# Patient Record
Sex: Female | Born: 1935 | Race: Black or African American | Hispanic: No
Health system: Southern US, Community
[De-identification: ages and names within clinical notes are randomized; demographics above are authoritative.]

## PROBLEM LIST (undated history)

## (undated) DIAGNOSIS — I472 Ventricular tachycardia: Secondary | ICD-10-CM

## (undated) DIAGNOSIS — J9621 Acute and chronic respiratory failure with hypoxia: Secondary | ICD-10-CM

## (undated) DIAGNOSIS — I4729 Other ventricular tachycardia: Secondary | ICD-10-CM

## (undated) DIAGNOSIS — J690 Pneumonitis due to inhalation of food and vomit: Secondary | ICD-10-CM

## (undated) DIAGNOSIS — N189 Chronic kidney disease, unspecified: Secondary | ICD-10-CM

## (undated) DIAGNOSIS — N179 Acute kidney failure, unspecified: Secondary | ICD-10-CM

---

## 2018-04-24 ENCOUNTER — Inpatient Hospital Stay
Admission: AD | Admit: 2018-04-24 | Discharge: 2018-06-24 | Disposition: E | Payer: Medicare Other | Source: Other Acute Inpatient Hospital | Attending: Internal Medicine | Admitting: Internal Medicine

## 2018-04-24 DIAGNOSIS — J9690 Respiratory failure, unspecified, unspecified whether with hypoxia or hypercapnia: Secondary | ICD-10-CM

## 2018-04-24 DIAGNOSIS — Z992 Dependence on renal dialysis: Secondary | ICD-10-CM

## 2018-04-24 DIAGNOSIS — R0603 Acute respiratory distress: Secondary | ICD-10-CM

## 2018-04-24 DIAGNOSIS — J690 Pneumonitis due to inhalation of food and vomit: Secondary | ICD-10-CM | POA: Diagnosis present

## 2018-04-24 DIAGNOSIS — R52 Pain, unspecified: Secondary | ICD-10-CM

## 2018-04-24 DIAGNOSIS — Z0189 Encounter for other specified special examinations: Secondary | ICD-10-CM

## 2018-04-24 DIAGNOSIS — T8241XA Breakdown (mechanical) of vascular dialysis catheter, initial encounter: Secondary | ICD-10-CM

## 2018-04-24 DIAGNOSIS — J9621 Acute and chronic respiratory failure with hypoxia: Secondary | ICD-10-CM | POA: Diagnosis present

## 2018-04-24 DIAGNOSIS — Z4659 Encounter for fitting and adjustment of other gastrointestinal appliance and device: Secondary | ICD-10-CM

## 2018-04-24 DIAGNOSIS — N189 Chronic kidney disease, unspecified: Secondary | ICD-10-CM | POA: Diagnosis present

## 2018-04-24 DIAGNOSIS — Z431 Encounter for attention to gastrostomy: Secondary | ICD-10-CM

## 2018-04-24 DIAGNOSIS — N179 Acute kidney failure, unspecified: Secondary | ICD-10-CM

## 2018-04-24 DIAGNOSIS — I472 Ventricular tachycardia: Secondary | ICD-10-CM

## 2018-04-24 DIAGNOSIS — N289 Disorder of kidney and ureter, unspecified: Secondary | ICD-10-CM

## 2018-04-24 DIAGNOSIS — Z789 Other specified health status: Secondary | ICD-10-CM

## 2018-04-24 DIAGNOSIS — I4729 Other ventricular tachycardia: Secondary | ICD-10-CM

## 2018-04-24 DIAGNOSIS — T85598A Other mechanical complication of other gastrointestinal prosthetic devices, implants and grafts, initial encounter: Secondary | ICD-10-CM

## 2018-04-24 HISTORY — DX: Chronic kidney disease, unspecified: N18.9

## 2018-04-24 HISTORY — DX: Ventricular tachycardia: I47.2

## 2018-04-24 HISTORY — DX: Acute kidney failure, unspecified: N17.9

## 2018-04-24 HISTORY — DX: Acute and chronic respiratory failure with hypoxia: J96.21

## 2018-04-24 HISTORY — DX: Pneumonitis due to inhalation of food and vomit: J69.0

## 2018-04-24 HISTORY — DX: Other ventricular tachycardia: I47.29

## 2018-04-25 ENCOUNTER — Other Ambulatory Visit (HOSPITAL_COMMUNITY): Payer: Medicare Other

## 2018-04-25 LAB — BASIC METABOLIC PANEL
Anion gap: 12 (ref 5–15)
BUN: 110 mg/dL — ABNORMAL HIGH (ref 8–23)
CO2: 15 mmol/L — ABNORMAL LOW (ref 22–32)
Calcium: 8.6 mg/dL — ABNORMAL LOW (ref 8.9–10.3)
Chloride: 109 mmol/L (ref 98–111)
Creatinine, Ser: 2.98 mg/dL — ABNORMAL HIGH (ref 0.44–1.00)
GFR calc Af Amer: 16 mL/min — ABNORMAL LOW (ref >60)
GFR calc non Af Amer: 14 mL/min — ABNORMAL LOW (ref >60)
Glucose, Bld: 258 mg/dL — ABNORMAL HIGH (ref 70–99)
Potassium: 3.3 mmol/L — ABNORMAL LOW (ref 3.5–5.1)
Sodium: 136 mmol/L (ref 135–145)

## 2018-04-25 LAB — CBC
HCT: 30.2 % — ABNORMAL LOW (ref 36.0–46.0)
Hemoglobin: 10.2 g/dL — ABNORMAL LOW (ref 12.0–15.0)
MCH: 27.5 pg (ref 26.0–34.0)
MCHC: 33.8 g/dL (ref 30.0–36.0)
MCV: 81.4 fL (ref 80.0–100.0)
Platelets: 243 10*3/uL (ref 150–400)
RBC: 3.71 MIL/uL — ABNORMAL LOW (ref 3.87–5.11)
RDW: 16.2 % — ABNORMAL HIGH (ref 11.5–15.5)
WBC: 11 10*3/uL — ABNORMAL HIGH (ref 4.0–10.5)
nRBC: 0 % (ref 0.0–0.2)

## 2018-04-25 MED ORDER — GENERIC EXTERNAL MEDICATION
Status: DC
Start: ? — End: 2018-04-25

## 2018-04-25 MED ORDER — ACETAMINOPHEN 650 MG RE SUPP
650.00 | RECTAL | Status: DC
Start: ? — End: 2018-04-25

## 2018-04-25 MED ORDER — DEXTROSE 10 % IV SOLN
63.00 | INTRAVENOUS | Status: DC
Start: ? — End: 2018-04-25

## 2018-04-25 MED ORDER — DEXTROSE-NACL 5-0.9 % IV SOLN
63.00 | INTRAVENOUS | Status: DC
Start: ? — End: 2018-04-25

## 2018-04-25 MED ORDER — ALTEPLASE 2 MG IJ SOLR
2.00 | INTRAMUSCULAR | Status: DC
Start: ? — End: 2018-04-25

## 2018-04-25 MED ORDER — IOPAMIDOL (ISOVUE-370) INJECTION 76%
100.00 | INTRAVENOUS | Status: DC
Start: ? — End: 2018-04-25

## 2018-04-25 MED ORDER — SODIUM CHLORIDE (PF) 0.9 % IJ SOLN
10.00 | INTRAMUSCULAR | Status: DC
Start: ? — End: 2018-04-25

## 2018-04-25 MED ORDER — INSULIN GLARGINE 100 UNIT/ML ~~LOC~~ SOLN
10.00 | SUBCUTANEOUS | Status: DC
Start: 2018-04-25 — End: 2018-04-25

## 2018-04-25 MED ORDER — SODIUM CHLORIDE 0.45 % IV SOLN
50.00 | INTRAVENOUS | Status: DC
Start: ? — End: 2018-04-25

## 2018-04-25 MED ORDER — FUROSEMIDE 10 MG/ML IJ SOLN
20.00 | INTRAMUSCULAR | Status: DC
Start: 2018-04-25 — End: 2018-04-25

## 2018-04-25 MED ORDER — FAT EMULSION PLANT BASED 20 % IV EMUL
250.00 | INTRAVENOUS | Status: DC
Start: ? — End: 2018-04-25

## 2018-04-25 MED ORDER — METOPROLOL TARTRATE 5 MG/5ML IV SOLN
2.50 | INTRAVENOUS | Status: DC
Start: 2018-04-24 — End: 2018-04-25

## 2018-04-25 MED ORDER — ENOXAPARIN SODIUM 30 MG/0.3ML ~~LOC~~ SOLN
30.00 | SUBCUTANEOUS | Status: DC
Start: 2018-04-25 — End: 2018-04-25

## 2018-04-25 MED ORDER — HYDRALAZINE HCL 20 MG/ML IJ SOLN
10.00 | INTRAMUSCULAR | Status: DC
Start: ? — End: 2018-04-25

## 2018-04-25 MED ORDER — GENERIC EXTERNAL MEDICATION
Status: DC
Start: 2018-04-24 — End: 2018-04-25

## 2018-04-26 LAB — BASIC METABOLIC PANEL
Anion gap: 13 (ref 5–15)
BUN: 111 mg/dL — ABNORMAL HIGH (ref 8–23)
CO2: 15 mmol/L — ABNORMAL LOW (ref 22–32)
Calcium: 8.9 mg/dL (ref 8.9–10.3)
Chloride: 110 mmol/L (ref 98–111)
Creatinine, Ser: 3.1 mg/dL — ABNORMAL HIGH (ref 0.44–1.00)
GFR calc Af Amer: 15 mL/min — ABNORMAL LOW (ref >60)
GFR calc non Af Amer: 13 mL/min — ABNORMAL LOW (ref >60)
Glucose, Bld: 175 mg/dL — ABNORMAL HIGH (ref 70–99)
Potassium: 4.1 mmol/L (ref 3.5–5.1)
Sodium: 138 mmol/L (ref 135–145)

## 2018-04-26 LAB — CBC
HCT: 33.6 % — ABNORMAL LOW (ref 36.0–46.0)
Hemoglobin: 10.7 g/dL — ABNORMAL LOW (ref 12.0–15.0)
MCH: 26.4 pg (ref 26.0–34.0)
MCHC: 31.8 g/dL (ref 30.0–36.0)
MCV: 83 fL (ref 80.0–100.0)
Platelets: 300 10*3/uL (ref 150–400)
RBC: 4.05 MIL/uL (ref 3.87–5.11)
RDW: 16.3 % — ABNORMAL HIGH (ref 11.5–15.5)
WBC: 15.3 10*3/uL — ABNORMAL HIGH (ref 4.0–10.5)
nRBC: 0 % (ref 0.0–0.2)

## 2018-04-26 LAB — PHOSPHORUS: Phosphorus: 4.5 mg/dL (ref 2.5–4.6)

## 2018-04-26 LAB — MAGNESIUM: Magnesium: 1.8 mg/dL (ref 1.7–2.4)

## 2018-04-26 LAB — TRIGLYCERIDES: Triglycerides: 125 mg/dL (ref <150)

## 2018-04-28 LAB — BASIC METABOLIC PANEL
Anion gap: 12 (ref 5–15)
BUN: 146 mg/dL — ABNORMAL HIGH (ref 8–23)
CO2: 12 mmol/L — ABNORMAL LOW (ref 22–32)
Calcium: 8.4 mg/dL — ABNORMAL LOW (ref 8.9–10.3)
Chloride: 114 mmol/L — ABNORMAL HIGH (ref 98–111)
Creatinine, Ser: 3.3 mg/dL — ABNORMAL HIGH (ref 0.44–1.00)
GFR calc Af Amer: 14 mL/min — ABNORMAL LOW (ref >60)
GFR calc non Af Amer: 12 mL/min — ABNORMAL LOW (ref >60)
Glucose, Bld: 306 mg/dL — ABNORMAL HIGH (ref 70–99)
Potassium: 4.1 mmol/L (ref 3.5–5.1)
Sodium: 138 mmol/L (ref 135–145)

## 2018-04-29 ENCOUNTER — Other Ambulatory Visit (HOSPITAL_COMMUNITY): Payer: Medicare Other

## 2018-04-29 LAB — BASIC METABOLIC PANEL
Anion gap: 12 (ref 5–15)
BUN: 142 mg/dL — ABNORMAL HIGH (ref 8–23)
CO2: 14 mmol/L — ABNORMAL LOW (ref 22–32)
Calcium: 8.3 mg/dL — ABNORMAL LOW (ref 8.9–10.3)
Chloride: 111 mmol/L (ref 98–111)
Creatinine, Ser: 3.52 mg/dL — ABNORMAL HIGH (ref 0.44–1.00)
GFR calc Af Amer: 13 mL/min — ABNORMAL LOW (ref >60)
GFR calc non Af Amer: 11 mL/min — ABNORMAL LOW (ref >60)
Glucose, Bld: 161 mg/dL — ABNORMAL HIGH (ref 70–99)
Potassium: 3.9 mmol/L (ref 3.5–5.1)
Sodium: 137 mmol/L (ref 135–145)

## 2018-04-29 LAB — PHOSPHORUS: Phosphorus: 6 mg/dL — ABNORMAL HIGH (ref 2.5–4.6)

## 2018-04-29 LAB — MAGNESIUM: Magnesium: 1.8 mg/dL (ref 1.7–2.4)

## 2018-04-29 MED ORDER — GENERIC EXTERNAL MEDICATION
Status: DC
Start: ? — End: 2018-04-29

## 2018-04-29 NOTE — Consult Note (Signed)
[facility] CONSULT NOTE    Date: 04/29/2018                  Patient Name:  Lori Mack  MRN: [id number]  DOB: [birth year]  Age / Sex: 85 y.o., female         PCP: Edmonia James, PA-C                 Service Requesting Consult: Hospitalist                 Reason for Consult: Acute renal failure            History of Present Illness: Patient is a 85 y.o. female with a PMHx of stage IV sacral decubitus ulcer, failure to thrive, GERD, immobility, severe protein calorie malnutrition, recent acute renal failure, MGUS, diabetes mellitus type 2, hypertension, who was admitted to [facility] on 05/21/2018 for ongoing treatment of Candida glabrata sepsis, pancolitis with gastritis, altered mental status, failure to thrive, protein calorie malnutrition, recurrent aspiration, and acute renal failure.  We are asked to see the patient for acute renal failure in the setting of known chronic kidney disease stage IV.  Prior baseline creatinine was 1.6.  Upon admission here creatinine was 2.98 but has now risen to 3.52.  Urine output 550 cc.  Patient unable to provide any history regarding current illness.   Medications: D10 drip, Lasix 20 mg IV daily, thiamine 100 mg IV daily, Humalog sliding scale, albuterol 3 cc inhaled 3 times daily, Lantus insulin 10 units daily, Lovenox 30 mg daily, metoprolol 2.5 mg 4 times daily, morphine 1 mg IV push PRN    Allergies: No known drug allergies   Past Medical History: stage IV sacral decubitus ulcer, failure to thrive, GERD, immobility, severe protein calorie malnutrition, recent acute renal failure, MGUS, diabetes mellitus type 2, hypertension  Past Surgical History: Hernia repair Appendectomy  Family History: Significant for hypertension and CVA in the patient's mother.  Social History: Unable to obtain from the patient and she has altered mental status.   Review of Systems: Unable to obtain from patient as she has altered  mental status.  Vital Signs: Temperature 98.9 pulse 111 respirations 23 blood pressure 131/66 Weight trends: There were no vitals filed for this visit.  Physical Exam: General: Chronically ill appearing  Head: Normocephalic, atraumatic.  Eyes: Anicteric, EOMI  Nose: Mucous membranes dry, not inflammed, nonerythematous.  Throat: Oropharynx nonerythematous, no exudate appreciated.   Neck: Supple, trachea midline.  Lungs:  Normal respiratory effort. Clear to auscultation BL without crackles or wheezes.  Heart: RRR. S1 and S2 normal without gallop, murmur, or rubs.  Abdomen:  BS normoactive. Soft, Nondistended, non-tender.  No masses or organomegaly.  Extremities: No pretibial edema.  Neurologic: Arousable but non conversant  Skin: No visible rashes, scars.    Lab results: Basic Metabolic Panel: Recent Labs  Lab 04/26/18 0438 04/28/18 0727 04/29/18 0443  NA 138 138 137  K 4.1 4.1 3.9  CL 110 114* 111  CO2 15* 12* 14*  GLUCOSE 175* 306* 161*  BUN 111* 146* 142*  CREATININE 3.10* 3.30* 3.52*  CALCIUM 8.9 8.4* 8.3*  MG 1.8  --  1.8  PHOS 4.5  --  6.0*    Liver Function Tests: No results for input(s): AST, ALT, ALKPHOS, BILITOT, PROT, ALBUMIN in the last 168 hours. No results for input(s): LIPASE, AMYLASE in the last 168 hours. No results for input(s): AMMONIA in the last 168 hours.  CBC: Recent Labs  Lab 04/25/18 0755 04/26/18 0438  WBC 11.0* 15.3*  HGB 10.2* 10.7*  HCT 30.2* 33.6*  MCV 81.4 83.0  PLT 243 300    Cardiac Enzymes: No results for input(s): CKTOTAL, CKMB, CKMBINDEX, TROPONINI in the last 168 hours.  BNP: Invalid input(s): POCBNP  CBG: No results for input(s): GLUCAP in the last 168 hours.  Microbiology: No results found for this or any previous visit.  Coagulation Studies: No results for input(s): LABPROT, INR in the last 72 hours.  Urinalysis: No results for input(s): COLORURINE, LABSPEC, PHURINE, GLUCOSEU, HGBUR, BILIRUBINUR,  KETONESUR, PROTEINUR, UROBILINOGEN, NITRITE, LEUKOCYTESUR in the last 72 hours.  Invalid input(s): APPERANCEUR    Imaging:  No results found.   Assessment & Plan: Pt is a 85 y.o. female with a PMHx of stage IV sacral decubitus ulcer, failure to thrive, GERD, immobility, severe protein calorie malnutrition, recent acute renal failure, MGUS, diabetes mellitus type 2, hypertension, who was admitted to [facility] on 05/08/2018 for ongoing treatment of Candida glabrata sepsis, pancolitis with gastritis, altered mental status, failure to thrive, protein calorie malnutrition, recurrent aspiration, and acute renal failure.  1.  Acute renal failure. 2.  Chronic kidney disease stage IV. 3.  Altered mental status. 4.  Failure to thrive. 5.  Severe protein calorie malnutrition.  Plan: We asked to see the patient for evaluation management of acute renal failure.  Suspect that Lasix may be playing some role in acute deterioration.  Discontinue Lasix for now.  Continue D10 for now.  Consider renal ultrasound if renal function does not improve.  Patient would make a very poor candidate for long-term dialysis given her multiple comorbidities, severe malnutrition, nonverbal status.  Further plan as patient progresses.  Thanks for consultation.

## 2018-04-30 ENCOUNTER — Other Ambulatory Visit (HOSPITAL_COMMUNITY): Payer: Medicare Other

## 2018-04-30 LAB — RENAL FUNCTION PANEL
Albumin: 1.4 g/dL — ABNORMAL LOW (ref 3.5–5.0)
Anion gap: 15 (ref 5–15)
BUN: 136 mg/dL — ABNORMAL HIGH (ref 8–23)
CO2: 11 mmol/L — ABNORMAL LOW (ref 22–32)
Calcium: 8.4 mg/dL — ABNORMAL LOW (ref 8.9–10.3)
Chloride: 110 mmol/L (ref 98–111)
Creatinine, Ser: 3.81 mg/dL — ABNORMAL HIGH (ref 0.44–1.00)
GFR calc Af Amer: 12 mL/min — ABNORMAL LOW (ref >60)
GFR calc non Af Amer: 10 mL/min — ABNORMAL LOW (ref >60)
Glucose, Bld: 186 mg/dL — ABNORMAL HIGH (ref 70–99)
Phosphorus: 6.5 mg/dL — ABNORMAL HIGH (ref 2.5–4.6)
Potassium: 3.8 mmol/L (ref 3.5–5.1)
Sodium: 136 mmol/L (ref 135–145)

## 2018-04-30 MED ORDER — GENERIC EXTERNAL MEDICATION
Status: DC
Start: ? — End: 2018-04-30

## 2018-04-30 NOTE — Progress Notes (Signed)
IR requested by Janora Norlander, NP for possible image-guided percutaneous gastrotomy tube placement.  CT abdomen from 04/29/2018 reviewed by Dr. Vernard Gambles who states patient's anatomy is not approachable for percutaneous approach. Because of this, procedure will not occur. Dr. Owens Shark made aware.  IR available in future if needed.  Bea Graff Louk, PA-C 04/30/2018, 8:54 AM

## 2018-05-01 LAB — TRIGLYCERIDES: Triglycerides: 149 mg/dL (ref <150)

## 2018-05-01 LAB — CBC
HCT: 28 % — ABNORMAL LOW (ref 36.0–46.0)
Hemoglobin: 9.1 g/dL — ABNORMAL LOW (ref 12.0–15.0)
MCH: 26.5 pg (ref 26.0–34.0)
MCHC: 32.5 g/dL (ref 30.0–36.0)
MCV: 81.6 fL (ref 80.0–100.0)
Platelets: 272 10*3/uL (ref 150–400)
RBC: 3.43 MIL/uL — ABNORMAL LOW (ref 3.87–5.11)
RDW: 15.9 % — ABNORMAL HIGH (ref 11.5–15.5)
WBC: 13.8 10*3/uL — ABNORMAL HIGH (ref 4.0–10.5)
nRBC: 0 % (ref 0.0–0.2)

## 2018-05-01 LAB — BASIC METABOLIC PANEL
Anion gap: 15 (ref 5–15)
BUN: 139 mg/dL — ABNORMAL HIGH (ref 8–23)
CO2: 12 mmol/L — ABNORMAL LOW (ref 22–32)
Calcium: 8.6 mg/dL — ABNORMAL LOW (ref 8.9–10.3)
Chloride: 107 mmol/L (ref 98–111)
Creatinine, Ser: 4.01 mg/dL — ABNORMAL HIGH (ref 0.44–1.00)
GFR calc Af Amer: 11 mL/min — ABNORMAL LOW (ref >60)
GFR calc non Af Amer: 10 mL/min — ABNORMAL LOW (ref >60)
Glucose, Bld: 234 mg/dL — ABNORMAL HIGH (ref 70–99)
Potassium: 3.9 mmol/L (ref 3.5–5.1)
Sodium: 134 mmol/L — ABNORMAL LOW (ref 135–145)

## 2018-05-01 LAB — MAGNESIUM: Magnesium: 1.6 mg/dL — ABNORMAL LOW (ref 1.7–2.4)

## 2018-05-01 NOTE — Progress Notes (Signed)
[facility]  ROUNDING NOTE   Subjective:  Patient remains critically ill. BUN currently 139 with a creatinine of 4.01. Still producing urine however. Also appears to have significant metabolic acidosis with most recent serum bicarbonate of 12.   Objective:  Vital signs in last 24 hours:  Temperature 97.4 pulse 110 respirations 30 blood pressure 157/82  Physical Exam: General: Critically ill-appearing female  Head: Normocephalic, atraumatic. Moist oral mucosal membranes  Eyes: Anicteric  Neck: Supple, trachea midline  Lungs:  Scattered rhonchi  Heart: S1S2 no rubs  Abdomen:  Soft, nontender, bowel sounds present  Extremities: 1+ peripheral edema.  Neurologic: Lethargic but arousable  Skin: No lesions       Basic Metabolic Panel: Recent Labs  Lab 04/26/18 0438 04/28/18 0727 04/29/18 0443 04/30/18 0611 05/01/18 0534  NA 138 138 137 136 134*  K 4.1 4.1 3.9 3.8 3.9  CL 110 114* 111 110 107  CO2 15* 12* 14* 11* 12*  GLUCOSE 175* 306* 161* 186* 234*  BUN 111* 146* 142* 136* 139*  CREATININE 3.10* 3.30* 3.52* 3.81* 4.01*  CALCIUM 8.9 8.4* 8.3* 8.4* 8.6*  MG 1.8  --  1.8  --  1.6*  PHOS 4.5  --  6.0* 6.5*  --     Liver Function Tests: Recent Labs  Lab 04/30/18 0611  ALBUMIN 1.4*   No results for input(s): LIPASE, AMYLASE in the last 168 hours. No results for input(s): AMMONIA in the last 168 hours.  CBC: Recent Labs  Lab 04/25/18 0755 04/26/18 0438 05/01/18 0534  WBC 11.0* 15.3* 13.8*  HGB 10.2* 10.7* 9.1*  HCT 30.2* 33.6* 28.0*  MCV 81.4 83.0 81.6  PLT 243 300 272    Cardiac Enzymes: No results for input(s): CKTOTAL, CKMB, CKMBINDEX, TROPONINI in the last 168 hours.  BNP: Invalid input(s): POCBNP  CBG: No results for input(s): GLUCAP in the last 168 hours.  Microbiology: No results found for this or any previous visit.  Coagulation Studies: No results for input(s): LABPROT, INR in the last 72 hours.  Urinalysis: No results for  input(s): COLORURINE, LABSPEC, PHURINE, GLUCOSEU, HGBUR, BILIRUBINUR, KETONESUR, PROTEINUR, UROBILINOGEN, NITRITE, LEUKOCYTESUR in the last 72 hours.  Invalid input(s): APPERANCEUR    Imaging: Ct Abdomen Wo Contrast  Result Date: 04/29/2018 CLINICAL DATA:  Gastrostomy tube evaluation EXAM: CT ABDOMEN AND PELVIS WITHOUT CONTRAST TECHNIQUE: Multidetector CT imaging of the abdomen and pelvis was performed following the standard protocol without IV contrast. COMPARISON:  None. FINDINGS: Lower chest: There is patchy hyperdense material in both posterior lower lobes. This may represent calcification, however aspirated barium is also a possibility. There is mucoid filling of right lower lobe bronchial airways and more confluent consolidation at the posterior right lung base. There are nonspecific reticular markings at the lung bases left greater than right which may represent early interstitial lung disease. Hepatobiliary: Unremarkable Pancreas: Unremarkable Spleen: Unremarkable Adrenals/Urinary Tract: There is prominence of the adrenal glands without focal mass in a hyperplasia pattern. There is a punctate calculus in the upper pole of the left kidney. Atrophy of the right kidney is noted. Stomach/Bowel: The body of the stomach is position posterior and inferior to the transverse colon. Gastrostomy tube placement is virtually contraindicated based on this anatomy. Small bowel is decompressed. No obvious mass in the colon. Vascular/Lymphatic: No abnormal retroperitoneal adenopathy. Aortic and iliac artery atherosclerotic calcifications are noted. Other: No free fluid. Musculoskeletal: No vertebral compression deformity. Mild scoliosis is noted. IMPRESSION: The body of the stomach is position posterior  and inferior to the transverse colon. Gastrostomy tube placement is virtually contraindicated based on anatomy. There is consolidation at the posterior right lung base associated with filling of the airways. There is  also hyperdense material representing calcification or possibly aspirated barium. Electronically Signed   By: Marybelle Killings M.D.   On: 04/29/2018 15:42   US Renal  Result Date: 04/30/2018 CLINICAL DATA:  Acute renal failure. EXAM: RENAL / URINARY TRACT ULTRASOUND COMPLETE COMPARISON:  CT scan of the abdomen dated 04/29/2018 FINDINGS: Right Kidney: Renal measurements: 7.6 x 3.0 x 3.2 cm = volume: 38 mL. Diffuse increased echogenicity of the renal parenchyma. No hydronephrosis. No masses. Left Kidney: Renal measurements: 9.7 x 4.9 x 3.9 cm = volume: 99 mL. Increased echogenicity without hydronephrosis. No mass lesions. Bladder: Extensive debris in the dependent portion of the bladder. IMPRESSION: 1. Small echogenic kidneys consistent with renal medical disease. 2. Debris in the bladder, nonspecific. However, this is common with bladder infections. Electronically Signed   By: Lorriane Shire M.D.   On: 04/30/2018 16:13     Medications:       Assessment/ Plan:  85 y.o. female with a PMHx of stage IV sacral decubitus ulcer, failure to thrive, GERD, immobility, severe protein calorie malnutrition, recent acute renal failure, MGUS, diabetes mellitus type 2, hypertension, who was admitted to [facility] on 05/01/2018 for ongoing treatment of Candida glabrata sepsis, pancolitis with gastritis, altered mental status, failure to thrive, protein calorie malnutrition, recurrent aspiration, and acute renal failure.  1.  Acute renal failure. 2.  Chronic kidney disease stage IV. 3.  Altered mental status. 4.  Failure to thrive. 5.  Severe protein calorie malnutrition.  Plan:   Patient continues to have significant renal dysfunction.  Creatinine currently 4.0.  Patient is producing some urine.  No urgent indication to start dialysis however this may need to be considered if family continues to desire aggressive care.  Her anatomy was not suitable for a PEG tube placement.  Diuretics have been stopped for now.   Continue supportive care for now.  We will continue to monitor her progress closely.   LOS: 0 Lori Mack 4/8/20208:18 AM

## 2018-05-02 ENCOUNTER — Other Ambulatory Visit (HOSPITAL_COMMUNITY): Payer: Medicare Other

## 2018-05-02 LAB — BASIC METABOLIC PANEL
Anion gap: 16 — ABNORMAL HIGH (ref 5–15)
BUN: 129 mg/dL — ABNORMAL HIGH (ref 8–23)
CO2: 11 mmol/L — ABNORMAL LOW (ref 22–32)
Calcium: 8.5 mg/dL — ABNORMAL LOW (ref 8.9–10.3)
Chloride: 107 mmol/L (ref 98–111)
Creatinine, Ser: 4.13 mg/dL — ABNORMAL HIGH (ref 0.44–1.00)
GFR calc Af Amer: 11 mL/min — ABNORMAL LOW (ref >60)
GFR calc non Af Amer: 9 mL/min — ABNORMAL LOW (ref >60)
Glucose, Bld: 187 mg/dL — ABNORMAL HIGH (ref 70–99)
Potassium: 4.1 mmol/L (ref 3.5–5.1)
Sodium: 134 mmol/L — ABNORMAL LOW (ref 135–145)

## 2018-05-02 LAB — GLUCOSE 6 PHOSPHATE DEHYDROGENASE
G6PDH: 13.2 U/g{Hb} (ref 4.6–13.5)
Hemoglobin: 9.3 g/dL — ABNORMAL LOW (ref 11.1–15.9)

## 2018-05-02 LAB — PHOSPHORUS: Phosphorus: 7.1 mg/dL — ABNORMAL HIGH (ref 2.5–4.6)

## 2018-05-02 LAB — MAGNESIUM: Magnesium: 2.5 mg/dL — ABNORMAL HIGH (ref 1.7–2.4)

## 2018-05-02 MED ORDER — GENERIC EXTERNAL MEDICATION
Status: DC
Start: ? — End: 2018-05-02

## 2018-05-03 LAB — BASIC METABOLIC PANEL
Anion gap: 16 — ABNORMAL HIGH (ref 5–15)
BUN: 128 mg/dL — ABNORMAL HIGH (ref 8–23)
CO2: 13 mmol/L — ABNORMAL LOW (ref 22–32)
Calcium: 8.5 mg/dL — ABNORMAL LOW (ref 8.9–10.3)
Chloride: 107 mmol/L (ref 98–111)
Creatinine, Ser: 4.4 mg/dL — ABNORMAL HIGH (ref 0.44–1.00)
GFR calc Af Amer: 10 mL/min — ABNORMAL LOW (ref >60)
GFR calc non Af Amer: 8 mL/min — ABNORMAL LOW (ref >60)
Glucose, Bld: 166 mg/dL — ABNORMAL HIGH (ref 70–99)
Potassium: 3.6 mmol/L (ref 3.5–5.1)
Sodium: 136 mmol/L (ref 135–145)

## 2018-05-03 LAB — TRIGLYCERIDES: Triglycerides: 155 mg/dL — ABNORMAL HIGH (ref <150)

## 2018-05-03 NOTE — Progress Notes (Signed)
[facility]  ROUNDING NOTE   Subjective:  Patient remains critically ill. Urine output was only 300 cc over the preceding 24 hours. Continues to have significantly diminished renal function.   Objective:  Vital signs in last 24 hours:  Temperature 98.1 pulse 100 respirations 24 blood pressure 135/68  Physical Exam: General: Critically ill-appearing female  Head: Normocephalic, atraumatic. Moist oral mucosal membranes  Eyes: Anicteric  Neck: Supple, trachea midline  Lungs:  Scattered rhonchi  Heart: S1S2 no rubs  Abdomen:  Soft, nontender, bowel sounds present  Extremities: 1+ peripheral edema.  Neurologic: Lethargic but arousable  Skin: No lesions       Basic Metabolic Panel: Recent Labs  Lab 04/28/18 0727 04/29/18 0443 04/30/18 0611 05/01/18 0534 05/02/18 0650  NA 138 137 136 134* 134*  K 4.1 3.9 3.8 3.9 4.1  CL 114* 111 110 107 107  CO2 12* 14* 11* 12* 11*  GLUCOSE 306* 161* 186* 234* 187*  BUN 146* 142* 136* 139* 129*  CREATININE 3.30* 3.52* 3.81* 4.01* 4.13*  CALCIUM 8.4* 8.3* 8.4* 8.6* 8.5*  MG  --  1.8  --  1.6* 2.5*  PHOS  --  6.0* 6.5*  --  7.1*    Liver Function Tests: Recent Labs  Lab 04/30/18 0611  ALBUMIN 1.4*   No results for input(s): LIPASE, AMYLASE in the last 168 hours. No results for input(s): AMMONIA in the last 168 hours.  CBC: Recent Labs  Lab 05/01/18 0534  WBC 13.8*  HGB 9.1*  9.3*  HCT 28.0*  MCV 81.6  PLT 272    Cardiac Enzymes: No results for input(s): CKTOTAL, CKMB, CKMBINDEX, TROPONINI in the last 168 hours.  BNP: Invalid input(s): POCBNP  CBG: No results for input(s): GLUCAP in the last 168 hours.  Microbiology: No results found for this or any previous visit.  Coagulation Studies: No results for input(s): LABPROT, INR in the last 72 hours.  Urinalysis: No results for input(s): COLORURINE, LABSPEC, PHURINE, GLUCOSEU, HGBUR, BILIRUBINUR, KETONESUR, PROTEINUR, UROBILINOGEN, NITRITE, LEUKOCYTESUR  in the last 72 hours.  Invalid input(s): APPERANCEUR    Imaging: Dg Abd 1 View  Result Date: 05/02/2018 CLINICAL DATA:  Confirm nasogastric tube placement. EXAM: ABDOMEN - 1 VIEW COMPARISON:  CT, 04/29/2018 FINDINGS: Nasogastric tube passes below the diaphragm. Metallic tip projects in the mid stomach. Normal bowel gas pattern. IMPRESSION: Nasogastric tube metallic tip projects in the mid stomach. Electronically Signed   By: Lajean Manes M.D.   On: 05/02/2018 14:22     Medications:       Assessment/ Plan:  85 y.o. female with a PMHx of stage IV sacral decubitus ulcer, failure to thrive, GERD, immobility, severe protein calorie malnutrition, recent acute renal failure, MGUS, diabetes mellitus type 2, hypertension, who was admitted to [facility] on 05/09/2018 for ongoing treatment of Candida glabrata sepsis, pancolitis with gastritis, altered mental status, failure to thrive, protein calorie malnutrition, recurrent aspiration, and acute renal failure.  1.  Acute renal failure. 2.  Chronic kidney disease stage IV. 3.  Altered mental status. 4.  Failure to thrive. 5.  Severe protein calorie malnutrition. 6.  Acidosis.  Plan:   Patient remains critically ill.  She has multiple comorbidities.  Her BUN is now up to 129 with a creatinine of 4.13.  Doubt that renal placement therapy would change the ultimate outcome however this could be considered if the family continues to desire aggressive care.  Dr. Owens Shark to have discussion with the patient's family regarding goals of  care.  If aggressive care is desired then temporary dialysis catheter could be placed and we could potentially proceed with renal replacement therapy.  However as above patient has a very guarded overall prognosis.   LOS: 0 Yichen Gilardi 4/10/202010:49 AM

## 2018-05-04 LAB — BASIC METABOLIC PANEL WITH GFR
Anion gap: 16 — ABNORMAL HIGH (ref 5–15)
BUN: 130 mg/dL — ABNORMAL HIGH (ref 8–23)
CO2: 14 mmol/L — ABNORMAL LOW (ref 22–32)
Calcium: 8.5 mg/dL — ABNORMAL LOW (ref 8.9–10.3)
Chloride: 108 mmol/L (ref 98–111)
Creatinine, Ser: 4.37 mg/dL — ABNORMAL HIGH (ref 0.44–1.00)
GFR calc Af Amer: 10 mL/min — ABNORMAL LOW
GFR calc non Af Amer: 9 mL/min — ABNORMAL LOW
Glucose, Bld: 156 mg/dL — ABNORMAL HIGH (ref 70–99)
Potassium: 3.3 mmol/L — ABNORMAL LOW (ref 3.5–5.1)
Sodium: 138 mmol/L (ref 135–145)

## 2018-05-04 LAB — MAGNESIUM: Magnesium: 2.4 mg/dL (ref 1.7–2.4)

## 2018-05-05 LAB — BASIC METABOLIC PANEL
Anion gap: 14 (ref 5–15)
BUN: 137 mg/dL — ABNORMAL HIGH (ref 8–23)
CO2: 15 mmol/L — ABNORMAL LOW (ref 22–32)
Calcium: 8.5 mg/dL — ABNORMAL LOW (ref 8.9–10.3)
Chloride: 111 mmol/L (ref 98–111)
Creatinine, Ser: 4.45 mg/dL — ABNORMAL HIGH (ref 0.44–1.00)
GFR calc Af Amer: 10 mL/min — ABNORMAL LOW (ref >60)
GFR calc non Af Amer: 8 mL/min — ABNORMAL LOW (ref >60)
Glucose, Bld: 130 mg/dL — ABNORMAL HIGH (ref 70–99)
Potassium: 4.1 mmol/L (ref 3.5–5.1)
Sodium: 140 mmol/L (ref 135–145)

## 2018-05-05 LAB — MAGNESIUM: Magnesium: 2.2 mg/dL (ref 1.7–2.4)

## 2018-05-05 LAB — GLUCOSE 6 PHOSPHATE DEHYDROGENASE
G6PDH: 13.3 U/g{Hb} (ref 4.6–13.5)
Hemoglobin: 8 g/dL — ABNORMAL LOW (ref 11.1–15.9)

## 2018-05-05 LAB — PHOSPHORUS: Phosphorus: 5.2 mg/dL — ABNORMAL HIGH (ref 2.5–4.6)

## 2018-05-06 ENCOUNTER — Other Ambulatory Visit (HOSPITAL_COMMUNITY): Payer: Medicare Other

## 2018-05-06 LAB — BLOOD GAS, ARTERIAL
Acid-base deficit: 7.1 mmol/L — ABNORMAL HIGH (ref 0.0–2.0)
Bicarbonate: 16.5 mmol/L — ABNORMAL LOW (ref 20.0–28.0)
FIO2: 0.21
O2 Saturation: 95 %
Patient temperature: 98.6
pCO2 arterial: 25.4 mmHg — ABNORMAL LOW (ref 32.0–48.0)
pH, Arterial: 7.427 (ref 7.350–7.450)
pO2, Arterial: 71.8 mmHg — ABNORMAL LOW (ref 83.0–108.0)

## 2018-05-06 MED ORDER — GENERIC EXTERNAL MEDICATION
Status: DC
Start: ? — End: 2018-05-06

## 2018-05-06 NOTE — Progress Notes (Signed)
  [facility]  ROUNDING NOTE   Subjective:  Patient seen at bedside. Resting comfortably at the moment. Renal function remains quite low. Had discussion with the patient's son who is requesting initiation of dialysis.   Objective:  Vital signs in last 24 hours:  Temperature 98.1 pulse 107 respirations 14 blood pressure 150/50  Physical Exam: General: Critically ill-appearing female  Head: Normocephalic, atraumatic. Moist oral mucosal membranes  Eyes: Anicteric  Neck: Supple, trachea midline  Lungs:  Scattered rhonchi  Heart: S1S2 no rubs  Abdomen:  Soft, nontender, bowel sounds present  Extremities: 1+ peripheral edema.  Neurologic: Lethargic but arousable  Skin: No lesions       Basic Metabolic Panel: Recent Labs  Lab 04/30/18 0611 05/01/18 0534 05/02/18 0650 05/03/18 1340 05/04/18 0420 05/05/18 0531  NA 136 134* 134* 136 138 140  K 3.8 3.9 4.1 3.6 3.3* 4.1  CL 110 107 107 107 108 111  CO2 11* 12* 11* 13* 14* 15*  GLUCOSE 186* 234* 187* 166* 156* 130*  BUN 136* 139* 129* 128* 130* 137*  CREATININE 3.81* 4.01* 4.13* 4.40* 4.37* 4.45*  CALCIUM 8.4* 8.6* 8.5* 8.5* 8.5* 8.5*  MG  --  1.6* 2.5*  --  2.4 2.2  PHOS 6.5*  --  7.1*  --   --  5.2*    Liver Function Tests: Recent Labs  Lab 04/30/18 0611  ALBUMIN 1.4*   No results for input(s): LIPASE, AMYLASE in the last 168 hours. No results for input(s): AMMONIA in the last 168 hours.  CBC: Recent Labs  Lab 05/01/18 0534 05/04/18 0420  WBC 13.8*  --   HGB 9.1*  9.3* 8.0*  HCT 28.0*  --   MCV 81.6  --   PLT 272  --     Cardiac Enzymes: No results for input(s): CKTOTAL, CKMB, CKMBINDEX, TROPONINI in the last 168 hours.  BNP: Invalid input(s): POCBNP  CBG: No results for input(s): GLUCAP in the last 168 hours.  Microbiology: No results found for this or any previous visit.  Coagulation Studies: No results for input(s): LABPROT, INR in the last 72 hours.  Urinalysis: No results for  input(s): COLORURINE, LABSPEC, PHURINE, GLUCOSEU, HGBUR, BILIRUBINUR, KETONESUR, PROTEINUR, UROBILINOGEN, NITRITE, LEUKOCYTESUR in the last 72 hours.  Invalid input(s): APPERANCEUR    Imaging: No results found.   Medications:       Assessment/ Plan:  85 y.o. female with a PMHx of stage IV sacral decubitus ulcer, failure to thrive, GERD, immobility, severe protein calorie malnutrition, recent acute renal failure, MGUS, diabetes mellitus type 2, hypertension, who was admitted to [facility] on 04/27/2018 for ongoing treatment of Candida glabrata sepsis, pancolitis with gastritis, altered mental status, failure to thrive, protein calorie malnutrition, recurrent aspiration, and acute renal failure.  1.  Acute renal failure. 2.  Chronic kidney disease stage IV. 3.  Altered mental status. 4.  Failure to thrive. 5.  Severe protein calorie malnutrition. 6.  Acidosis.  Plan:   Overall renal function remains quite low.  Creatinine currently 4.4 with an EGFR of 10.  Patient also has metabolic acidosis.  After discussion the risk, benefits, and alternatives with the son he wishes to proceed with dialysis treatment for his mother.  We will request interventional radiology consultation for placement of temporary dialysis catheter placement.  We will start first dialysis treatment tomorrow.  Overall prognosis remains quite guarded however.   LOS: 0 Lori Mack 4/13/20203:34 PM

## 2018-05-07 LAB — RENAL FUNCTION PANEL
Albumin: 1.5 g/dL — ABNORMAL LOW (ref 3.5–5.0)
Anion gap: 17 — ABNORMAL HIGH (ref 5–15)
BUN: 158 mg/dL — ABNORMAL HIGH (ref 8–23)
CO2: 16 mmol/L — ABNORMAL LOW (ref 22–32)
Calcium: 9.4 mg/dL (ref 8.9–10.3)
Chloride: 111 mmol/L (ref 98–111)
Creatinine, Ser: 4.76 mg/dL — ABNORMAL HIGH (ref 0.44–1.00)
GFR calc Af Amer: 9 mL/min — ABNORMAL LOW (ref >60)
GFR calc non Af Amer: 8 mL/min — ABNORMAL LOW (ref >60)
Glucose, Bld: 185 mg/dL — ABNORMAL HIGH (ref 70–99)
Phosphorus: 3.5 mg/dL (ref 2.5–4.6)
Potassium: 4.1 mmol/L (ref 3.5–5.1)
Sodium: 144 mmol/L (ref 135–145)

## 2018-05-07 LAB — CBC
HCT: 27.2 % — ABNORMAL LOW (ref 36.0–46.0)
Hemoglobin: 8.6 g/dL — ABNORMAL LOW (ref 12.0–15.0)
MCH: 26.3 pg (ref 26.0–34.0)
MCHC: 31.6 g/dL (ref 30.0–36.0)
MCV: 83.2 fL (ref 80.0–100.0)
Platelets: 245 10*3/uL (ref 150–400)
RBC: 3.27 MIL/uL — ABNORMAL LOW (ref 3.87–5.11)
RDW: 16.2 % — ABNORMAL HIGH (ref 11.5–15.5)
WBC: 12.2 10*3/uL — ABNORMAL HIGH (ref 4.0–10.5)
nRBC: 0 % (ref 0.0–0.2)

## 2018-05-07 LAB — MAGNESIUM: Magnesium: 2.1 mg/dL (ref 1.7–2.4)

## 2018-05-08 ENCOUNTER — Encounter (HOSPITAL_COMMUNITY): Payer: Self-pay | Admitting: Diagnostic Radiology

## 2018-05-08 ENCOUNTER — Other Ambulatory Visit (HOSPITAL_COMMUNITY): Payer: Medicare Other

## 2018-05-08 HISTORY — PX: IR FLUORO GUIDE CV LINE LEFT: IMG2282

## 2018-05-08 HISTORY — PX: IR US GUIDE VASC ACCESS LEFT: IMG2389

## 2018-05-08 LAB — CBC
HCT: 28.2 % — ABNORMAL LOW (ref 36.0–46.0)
Hemoglobin: 8.7 g/dL — ABNORMAL LOW (ref 12.0–15.0)
MCH: 26 pg (ref 26.0–34.0)
MCHC: 30.9 g/dL (ref 30.0–36.0)
MCV: 84.4 fL (ref 80.0–100.0)
Platelets: 236 10*3/uL (ref 150–400)
RBC: 3.34 MIL/uL — ABNORMAL LOW (ref 3.87–5.11)
RDW: 16.3 % — ABNORMAL HIGH (ref 11.5–15.5)
WBC: 12.4 10*3/uL — ABNORMAL HIGH (ref 4.0–10.5)
nRBC: 0 % (ref 0.0–0.2)

## 2018-05-08 LAB — HEPATITIS B SURFACE ANTIGEN: Hepatitis B Surface Ag: NEGATIVE

## 2018-05-08 LAB — RENAL FUNCTION PANEL
Albumin: 1.6 g/dL — ABNORMAL LOW (ref 3.5–5.0)
Anion gap: 15 (ref 5–15)
BUN: 177 mg/dL — ABNORMAL HIGH (ref 8–23)
CO2: 16 mmol/L — ABNORMAL LOW (ref 22–32)
Calcium: 9.7 mg/dL (ref 8.9–10.3)
Chloride: 113 mmol/L — ABNORMAL HIGH (ref 98–111)
Creatinine, Ser: 4.67 mg/dL — ABNORMAL HIGH (ref 0.44–1.00)
GFR calc Af Amer: 9 mL/min — ABNORMAL LOW (ref >60)
GFR calc non Af Amer: 8 mL/min — ABNORMAL LOW (ref >60)
Glucose, Bld: 212 mg/dL — ABNORMAL HIGH (ref 70–99)
Phosphorus: 3.4 mg/dL (ref 2.5–4.6)
Potassium: 4.2 mmol/L (ref 3.5–5.1)
Sodium: 144 mmol/L (ref 135–145)

## 2018-05-08 LAB — HEPATITIS B SURFACE ANTIBODY, QUANTITATIVE: Hepatitis B-Post: 4.7 m[IU]/mL — ABNORMAL LOW (ref >9.9)

## 2018-05-08 LAB — BASIC METABOLIC PANEL
Anion gap: 15 (ref 5–15)
BUN: 170 mg/dL — ABNORMAL HIGH (ref 8–23)
CO2: 17 mmol/L — ABNORMAL LOW (ref 22–32)
Calcium: 9.5 mg/dL (ref 8.9–10.3)
Chloride: 110 mmol/L (ref 98–111)
Creatinine, Ser: 4.59 mg/dL — ABNORMAL HIGH (ref 0.44–1.00)
GFR calc Af Amer: 9 mL/min — ABNORMAL LOW (ref >60)
GFR calc non Af Amer: 8 mL/min — ABNORMAL LOW (ref >60)
Glucose, Bld: 284 mg/dL — ABNORMAL HIGH (ref 70–99)
Potassium: 4 mmol/L (ref 3.5–5.1)
Sodium: 142 mmol/L (ref 135–145)

## 2018-05-08 LAB — PHOSPHORUS: Phosphorus: 3.5 mg/dL (ref 2.5–4.6)

## 2018-05-08 LAB — TRIGLYCERIDES: Triglycerides: 144 mg/dL (ref <150)

## 2018-05-08 LAB — HEPATITIS B CORE ANTIBODY, IGM: Hep B C IgM: NEGATIVE

## 2018-05-08 LAB — MAGNESIUM: Magnesium: 2.1 mg/dL (ref 1.7–2.4)

## 2018-05-08 MED ORDER — GENERIC EXTERNAL MEDICATION
Status: DC
Start: ? — End: 2018-05-08

## 2018-05-08 MED ORDER — LIDOCAINE HCL 1 % IJ SOLN
INTRAMUSCULAR | Status: DC | PRN
  Administered 2018-05-08: 10 mL

## 2018-05-08 MED ORDER — HEPARIN SODIUM (PORCINE) 1000 UNIT/ML IJ SOLN
INTRAMUSCULAR | Status: AC
  Administered 2018-05-08: 2.8 mL
  Filled 2018-05-08: qty 1

## 2018-05-08 MED ORDER — LIDOCAINE HCL 1 % IJ SOLN
INTRAMUSCULAR | Status: AC
  Filled 2018-05-08: qty 20

## 2018-05-08 NOTE — Procedures (Signed)
Interventional Radiology Procedure:   Indications: ARF  Procedure: Non-tunneled dialysis cathter placement  Findings: Tip at SVC/RA junction.  Left jugular access.   Complications: None     EBL: Minimal, less than 10 ml  Plan: Catheter is ready to use.     Arin Peral R. Anselm Pancoast, MD  Pager: [phone]

## 2018-05-08 NOTE — Progress Notes (Signed)
[facility]  ROUNDING NOTE   Subjective:  Temporary dialysis catheter was placed today. Patient still lethargic. Due for dialysis today.   Objective:  Vital signs in last 24 hours:  Temperature 97.9 pulse 127 respirations 30 blood pressure 167/88  Physical Exam: General: Critically ill-appearing female  Head: Normocephalic, atraumatic. Moist oral mucosal membranes  Eyes: Anicteric  Neck: Supple, trachea midline  Lungs:  Scattered rhonchi  Heart: S1S2 no rubs  Abdomen:  Soft, nontender, bowel sounds present  Extremities: 1+ peripheral edema.  Neurologic: Lethargic but arousable  Skin: No lesions       Basic Metabolic Panel: Recent Labs  Lab 05/02/18 0650 05/03/18 1340 05/04/18 0420 05/05/18 0531 05/07/18 0724 05/08/18 0625  NA 134* 136 138 140 144 142  K 4.1 3.6 3.3* 4.1 4.1 4.0  CL 107 107 108 111 111 110  CO2 11* 13* 14* 15* 16* 17*  GLUCOSE 187* 166* 156* 130* 185* 284*  BUN 129* 128* 130* 137* 158* 170*  CREATININE 4.13* 4.40* 4.37* 4.45* 4.76* 4.59*  CALCIUM 8.5* 8.5* 8.5* 8.5* 9.4 9.5  MG 2.5*  --  2.4 2.2 2.1 2.1  PHOS 7.1*  --   --  5.2* 3.5 3.5    Liver Function Tests: Recent Labs  Lab 05/07/18 0724  ALBUMIN 1.5*   No results for input(s): LIPASE, AMYLASE in the last 168 hours. No results for input(s): AMMONIA in the last 168 hours.  CBC: Recent Labs  Lab 05/04/18 0420 05/07/18 0724  WBC  --  12.2*  HGB 8.0* 8.6*  HCT  --  27.2*  MCV  --  83.2  PLT  --  245    Cardiac Enzymes: No results for input(s): CKTOTAL, CKMB, CKMBINDEX, TROPONINI in the last 168 hours.  BNP: Invalid input(s): POCBNP  CBG: No results for input(s): GLUCAP in the last 168 hours.  Microbiology: No results found for this or any previous visit.  Coagulation Studies: No results for input(s): LABPROT, INR in the last 72 hours.  Urinalysis: No results for input(s): COLORURINE, LABSPEC, PHURINE, GLUCOSEU, HGBUR, BILIRUBINUR, KETONESUR, PROTEINUR,  UROBILINOGEN, NITRITE, LEUKOCYTESUR in the last 72 hours.  Invalid input(s): APPERANCEUR    Imaging: Ir Fluoro Guide Cv Line Left  Result Date: 05/08/2018 INDICATION: 85 year old with acute renal failure and request for dialysis catheter. EXAM: FLUOROSCOPIC AND ULTRASOUND GUIDED PLACEMENT OF A NON-TUNNELED DIALYSIS CATHETER Physician: Stephan Minister. Henn, MD MEDICATIONS: None ANESTHESIA/SEDATION: None FLUOROSCOPY TIME:  Fluoroscopy Time: 54 seconds, 1 mGy COMPLICATIONS: None immediate. PROCEDURE: Informed consent was obtained for catheter placement. The patient was placed supine on the interventional table. Ultrasound confirmed a patent left internal jugular vein. Ultrasound images were obtained for documentation. The left side of the neck was prepped and draped in a sterile fashion. The left side of the neck was anesthetized with 1% lidocaine. Maximal barrier sterile technique was utilized including caps, mask, sterile gowns, sterile gloves, sterile drape, hand hygiene and skin antiseptic. A small incision was made with #11 blade scalpel. A 21 gauge needle directed into the left internal jugular vein with ultrasound guidance. A micropuncture dilator set was placed. A 20 cm Mahurkar catheter was selected. The catheter was advanced over a wire and positioned at the superior cavoatrial junction. Fluoroscopic images were obtained for documentation. Both dialysis lumens were found to aspirate and flush well. The proper amount of heparin was flushed in both lumens. The central venous lumen was flushed with normal saline. Catheter was sutured to skin. FINDINGS: Catheter tip at the  superior cavoatrial junction. IMPRESSION: Successful placement of a left jugular non-tunneled dialysis catheter using ultrasound and fluoroscopic guidance. Electronically Signed   By: Markus Daft M.D.   On: 05/08/2018 15:18   Ir US Guide Vasc Access Left  Result Date: 05/08/2018 INDICATION: 85 year old with acute renal failure and request  for dialysis catheter. EXAM: FLUOROSCOPIC AND ULTRASOUND GUIDED PLACEMENT OF A NON-TUNNELED DIALYSIS CATHETER Physician: Stephan Minister. Henn, MD MEDICATIONS: None ANESTHESIA/SEDATION: None FLUOROSCOPY TIME:  Fluoroscopy Time: 54 seconds, 1 mGy COMPLICATIONS: None immediate. PROCEDURE: Informed consent was obtained for catheter placement. The patient was placed supine on the interventional table. Ultrasound confirmed a patent left internal jugular vein. Ultrasound images were obtained for documentation. The left side of the neck was prepped and draped in a sterile fashion. The left side of the neck was anesthetized with 1% lidocaine. Maximal barrier sterile technique was utilized including caps, mask, sterile gowns, sterile gloves, sterile drape, hand hygiene and skin antiseptic. A small incision was made with #11 blade scalpel. A 21 gauge needle directed into the left internal jugular vein with ultrasound guidance. A micropuncture dilator set was placed. A 20 cm Mahurkar catheter was selected. The catheter was advanced over a wire and positioned at the superior cavoatrial junction. Fluoroscopic images were obtained for documentation. Both dialysis lumens were found to aspirate and flush well. The proper amount of heparin was flushed in both lumens. The central venous lumen was flushed with normal saline. Catheter was sutured to skin. FINDINGS: Catheter tip at the superior cavoatrial junction. IMPRESSION: Successful placement of a left jugular non-tunneled dialysis catheter using ultrasound and fluoroscopic guidance. Electronically Signed   By: Markus Daft M.D.   On: 05/08/2018 15:18     Medications:       Assessment/ Plan:  85 y.o. female with a PMHx of stage IV sacral decubitus ulcer, failure to thrive, GERD, immobility, severe protein calorie malnutrition, recent acute renal failure, MGUS, diabetes mellitus type 2, hypertension, who was admitted to [facility] on 05/19/2018 for ongoing treatment of Candida glabrata  sepsis, pancolitis with gastritis, altered mental status, failure to thrive, protein calorie malnutrition, recurrent aspiration, and acute renal failure.  1.  Acute renal failure. 2.  Chronic kidney disease stage IV. 3.  Altered mental status. 4.  Failure to thrive. 5.  Severe protein calorie malnutrition. 6.  Acidosis.  Plan:   Temporary dialysis catheter was placed today by interventional radiology.  We will plan for first dialysis treatment today and second dialysis treatment tomorrow now that temporary dialysis catheter is in place.  However as before patient has a very guarded and poor overall prognosis.  Unclear as to whether renal replacement therapy will change the ultimate outcome.  We will reevaluate patient on Friday.   LOS: 0 Greggory Safranek 4/15/20203:48 PM

## 2018-05-09 LAB — RENAL FUNCTION PANEL
Albumin: 1.3 g/dL — ABNORMAL LOW (ref 3.5–5.0)
Anion gap: 14 (ref 5–15)
BUN: 137 mg/dL — ABNORMAL HIGH (ref 8–23)
CO2: 19 mmol/L — ABNORMAL LOW (ref 22–32)
Calcium: 9.1 mg/dL (ref 8.9–10.3)
Chloride: 108 mmol/L (ref 98–111)
Creatinine, Ser: 3.77 mg/dL — ABNORMAL HIGH (ref 0.44–1.00)
GFR calc Af Amer: 12 mL/min — ABNORMAL LOW (ref >60)
GFR calc non Af Amer: 10 mL/min — ABNORMAL LOW (ref >60)
Glucose, Bld: 257 mg/dL — ABNORMAL HIGH (ref 70–99)
Phosphorus: 2.8 mg/dL (ref 2.5–4.6)
Potassium: 3.6 mmol/L (ref 3.5–5.1)
Sodium: 141 mmol/L (ref 135–145)

## 2018-05-09 LAB — GLUCOSE 6 PHOSPHATE DEHYDROGENASE
G6PDH: 11.9 U/g{Hb} (ref 4.6–13.5)
Hemoglobin: 8.8 g/dL — ABNORMAL LOW (ref 11.1–15.9)

## 2018-05-09 LAB — CBC
HCT: 24.7 % — ABNORMAL LOW (ref 36.0–46.0)
Hemoglobin: 8 g/dL — ABNORMAL LOW (ref 12.0–15.0)
MCH: 26.9 pg (ref 26.0–34.0)
MCHC: 32.4 g/dL (ref 30.0–36.0)
MCV: 83.2 fL (ref 80.0–100.0)
Platelets: 187 10*3/uL (ref 150–400)
RBC: 2.97 MIL/uL — ABNORMAL LOW (ref 3.87–5.11)
RDW: 16.2 % — ABNORMAL HIGH (ref 11.5–15.5)
WBC: 10.5 10*3/uL (ref 4.0–10.5)
nRBC: 0 % (ref 0.0–0.2)

## 2018-05-09 LAB — HEPATITIS B SURFACE ANTIGEN: Hepatitis B Surface Ag: NEGATIVE

## 2018-05-09 LAB — HEPATITIS B SURFACE ANTIBODY,QUALITATIVE: Hep B S Ab: NONREACTIVE

## 2018-05-09 LAB — HEPATITIS B CORE ANTIBODY, TOTAL: Hep B Core Total Ab: NEGATIVE

## 2018-05-10 NOTE — Progress Notes (Signed)
  [facility]  ROUNDING NOTE   Subjective:  Patient due for another dialysis treatment tomorrow. Still quite lethargic. NG tube in place as well.   Objective:  Vital signs in last 24 hours:  Temperature 97.8 pulse 110 respirations 31 blood pressure 123/60  Physical Exam: General: Critically ill-appearing female  Head: Normocephalic, atraumatic. Moist oral mucosal membranes  Eyes: Anicteric  Neck: Supple, trachea midline  Lungs:  Scattered rhonchi  Heart: S1S2 no rubs  Abdomen:  Soft, nontender, bowel sounds present  Extremities: 1+ peripheral edema.  Neurologic: Lethargic but arousable  Skin: No lesions       Basic Metabolic Panel: Recent Labs  Lab 05/04/18 0420 05/05/18 0531 05/07/18 0724 05/08/18 0625 05/08/18 1630 05/09/18 0544  NA 138 140 144 142 144 141  K 3.3* 4.1 4.1 4.0 4.2 3.6  CL 108 111 111 110 113* 108  CO2 14* 15* 16* 17* 16* 19*  GLUCOSE 156* 130* 185* 284* 212* 257*  BUN 130* 137* 158* 170* 177* 137*  CREATININE 4.37* 4.45* 4.76* 4.59* 4.67* 3.77*  CALCIUM 8.5* 8.5* 9.4 9.5 9.7 9.1  MG 2.4 2.2 2.1 2.1  --   --   PHOS  --  5.2* 3.5 3.5 3.4 2.8    Liver Function Tests: Recent Labs  Lab 05/07/18 0724 05/08/18 1630 05/09/18 0544  ALBUMIN 1.5* 1.6* 1.3*   No results for input(s): LIPASE, AMYLASE in the last 168 hours. No results for input(s): AMMONIA in the last 168 hours.  CBC: Recent Labs  Lab 05/04/18 0420 05/07/18 0724 05/08/18 1630 05/09/18 0544  WBC  --  12.2* 12.4* 10.5  HGB 8.0* 8.6*  8.8* 8.7* 8.0*  HCT  --  27.2* 28.2* 24.7*  MCV  --  83.2 84.4 83.2  PLT  --  245 236 187    Cardiac Enzymes: No results for input(s): CKTOTAL, CKMB, CKMBINDEX, TROPONINI in the last 168 hours.  BNP: Invalid input(s): POCBNP  CBG: No results for input(s): GLUCAP in the last 168 hours.  Microbiology: No results found for this or any previous visit.  Coagulation Studies: No results for input(s): LABPROT, INR in the last 72  hours.  Urinalysis: No results for input(s): COLORURINE, LABSPEC, PHURINE, GLUCOSEU, HGBUR, BILIRUBINUR, KETONESUR, PROTEINUR, UROBILINOGEN, NITRITE, LEUKOCYTESUR in the last 72 hours.  Invalid input(s): APPERANCEUR    Imaging: No results found.   Medications:       Assessment/ Plan:  85 y.o. female with a PMHx of stage IV sacral decubitus ulcer, failure to thrive, GERD, immobility, severe protein calorie malnutrition, recent acute renal failure, MGUS, diabetes mellitus type 2, hypertension, who was admitted to [facility] on 05/08/2018 for ongoing treatment of Candida glabrata sepsis, pancolitis with gastritis, altered mental status, failure to thrive, protein calorie malnutrition, recurrent aspiration, and acute renal failure.  1.  Acute renal failure. 2.  Chronic kidney disease stage IV. 3.  Altered mental status. 4.  Failure to thrive. 5.  Severe protein calorie malnutrition. 6.  Acidosis.  Plan:   Patient remains critically ill appearing.  Metabolic parameters have partially improved.  We will plan for dialysis again on Saturday.  Orders to be prepared.  No significant ultrafiltration planned.  NG tube has been placed and patient is receiving enteral nutrition.  We will continue to monitor her progress closely.  LOS: 0 Jazmen Lindenbaum 4/17/20203:33 PM

## 2018-05-11 LAB — CBC
HCT: 22.2 % — ABNORMAL LOW (ref 36.0–46.0)
Hemoglobin: 7.1 g/dL — ABNORMAL LOW (ref 12.0–15.0)
MCH: 26.7 pg (ref 26.0–34.0)
MCHC: 32 g/dL (ref 30.0–36.0)
MCV: 83.5 fL (ref 80.0–100.0)
Platelets: 192 10*3/uL (ref 150–400)
RBC: 2.66 MIL/uL — ABNORMAL LOW (ref 3.87–5.11)
RDW: 16 % — ABNORMAL HIGH (ref 11.5–15.5)
WBC: 10.3 10*3/uL (ref 4.0–10.5)
nRBC: 0 % (ref 0.0–0.2)

## 2018-05-11 LAB — RENAL FUNCTION PANEL
Albumin: 1.3 g/dL — ABNORMAL LOW (ref 3.5–5.0)
Anion gap: 13 (ref 5–15)
BUN: 131 mg/dL — ABNORMAL HIGH (ref 8–23)
CO2: 21 mmol/L — ABNORMAL LOW (ref 22–32)
Calcium: 9.1 mg/dL (ref 8.9–10.3)
Chloride: 106 mmol/L (ref 98–111)
Creatinine, Ser: 3.2 mg/dL — ABNORMAL HIGH (ref 0.44–1.00)
GFR calc Af Amer: 14 mL/min — ABNORMAL LOW (ref >60)
GFR calc non Af Amer: 12 mL/min — ABNORMAL LOW (ref >60)
Glucose, Bld: 176 mg/dL — ABNORMAL HIGH (ref 70–99)
Phosphorus: 2.4 mg/dL — ABNORMAL LOW (ref 2.5–4.6)
Potassium: 3.4 mmol/L — ABNORMAL LOW (ref 3.5–5.1)
Sodium: 140 mmol/L (ref 135–145)

## 2018-05-12 LAB — BLOOD GAS, ARTERIAL
Acid-Base Excess: 2.5 mmol/L — ABNORMAL HIGH (ref 0.0–2.0)
Bicarbonate: 25.4 mmol/L (ref 20.0–28.0)
FIO2: 0.21
O2 Saturation: 96.5 %
Patient temperature: 98.6
pCO2 arterial: 31.5 mmHg — ABNORMAL LOW (ref 32.0–48.0)
pH, Arterial: 7.517 — ABNORMAL HIGH (ref 7.350–7.450)
pO2, Arterial: 71.6 mmHg — ABNORMAL LOW (ref 83.0–108.0)

## 2018-05-13 ENCOUNTER — Other Ambulatory Visit (HOSPITAL_COMMUNITY): Payer: Medicare Other

## 2018-05-13 NOTE — Progress Notes (Signed)
[facility]  ROUNDING NOTE   Subjective:  Patient has tolerated initiation of dialysis relatively well however her overall condition has not significantly changed. Still not responding to commands at the moment.    Objective:  Vital signs in last 24 hours:  Temperature 99 pulse 102 respiration 32 blood pressure 125/57  Physical Exam: General: Critically ill-appearing female  Head: Normocephalic, atraumatic. Moist oral mucosal membranes  Eyes: Anicteric  Neck: Supple, trachea midline  Lungs:  Scattered rhonchi  Heart: S1S2 no rubs  Abdomen:  Soft, nontender, bowel sounds present  Extremities: 1+ peripheral edema.  Neurologic: Lethargic but arousable  Skin: No lesions       Basic Metabolic Panel: Recent Labs  Lab 05/07/18 0724 05/08/18 0625 05/08/18 1630 05/09/18 0544 05/11/18 0759  NA 144 142 144 141 140  K 4.1 4.0 4.2 3.6 3.4*  CL 111 110 113* 108 106  CO2 16* 17* 16* 19* 21*  GLUCOSE 185* 284* 212* 257* 176*  BUN 158* 170* 177* 137* 131*  CREATININE 4.76* 4.59* 4.67* 3.77* 3.20*  CALCIUM 9.4 9.5 9.7 9.1 9.1  MG 2.1 2.1  --   --   --   PHOS 3.5 3.5 3.4 2.8 2.4*    Liver Function Tests: Recent Labs  Lab 05/07/18 0724 05/08/18 1630 05/09/18 0544 05/11/18 0759  ALBUMIN 1.5* 1.6* 1.3* 1.3*   No results for input(s): LIPASE, AMYLASE in the last 168 hours. No results for input(s): AMMONIA in the last 168 hours.  CBC: Recent Labs  Lab 05/07/18 0724 05/08/18 1630 05/09/18 0544 05/11/18 0759  WBC 12.2* 12.4* 10.5 10.3  HGB 8.6*  8.8* 8.7* 8.0* 7.1*  HCT 27.2* 28.2* 24.7* 22.2*  MCV 83.2 84.4 83.2 83.5  PLT 245 236 187 192    Cardiac Enzymes: No results for input(s): CKTOTAL, CKMB, CKMBINDEX, TROPONINI in the last 168 hours.  BNP: Invalid input(s): POCBNP  CBG: No results for input(s): GLUCAP in the last 168 hours.  Microbiology: No results found for this or any previous visit.  Coagulation Studies: No results for input(s):  LABPROT, INR in the last 72 hours.  Urinalysis: No results for input(s): COLORURINE, LABSPEC, PHURINE, GLUCOSEU, HGBUR, BILIRUBINUR, KETONESUR, PROTEINUR, UROBILINOGEN, NITRITE, LEUKOCYTESUR in the last 72 hours.  Invalid input(s): APPERANCEUR    Imaging: Dg Chest Port 1 View  Result Date: 05/13/2018 CLINICAL DATA:  Acute renal failure.  Altered mental status. EXAM: PORTABLE CHEST 1 VIEW COMPARISON:  Single-view of the chest 04/25/2018. FINDINGS: Left IJ approach central venous catheter is in place with the tip projecting in the mid to lower superior vena cava. Feeding tube is also noted. There is mild interstitial edema. Very small bilateral pleural effusions are seen. Left basilar atelectasis noted. No pneumothorax. IMPRESSION: Left IJ approach central venous catheter tip projects in the mid to lower superior vena cava. No pneumothorax. Mild interstitial edema with associated very small effusions and basilar atelectasis. Electronically Signed   By: Inge Rise M.D.   On: 05/13/2018 07:40     Medications:       Assessment/ Plan:  85 y.o. female with a PMHx of stage IV sacral decubitus ulcer, failure to thrive, GERD, immobility, severe protein calorie malnutrition, recent acute renal failure, MGUS, diabetes mellitus type 2, hypertension, who was admitted to [facility] on 04/28/2018 for ongoing treatment of Candida glabrata sepsis, pancolitis with gastritis, altered mental status, failure to thrive, protein calorie malnutrition, recurrent aspiration, and acute renal failure.  1.  Acute renal failure. 2.  Chronic kidney  disease stage IV. 3.  Altered mental status. 4.  Failure to thrive. 5.  Severe protein calorie malnutrition. 6.  Acidosis. 7.  Anemia of chronic kidney disease.  Plan:   Critical illness persist at this time.  She has tolerated initiation of dialysis quite well however her overall condition has not changed significantly.  We will plan for dialysis again tomorrow.  The  patient's son did agree to a time-limited trial of hemodialysis.  May need to consider further goals of care discussion later in the week if her condition remains the same.  Otherwise continue supportive care at this time.  May need to consider blood transfusion if hemoglobin drops any further.   LOS: 0 Quamel Fitzmaurice 4/20/20208:49 AM

## 2018-05-14 LAB — BLOOD CULTURE ID PANEL (REFLEXED)

## 2018-05-14 LAB — CBC
HCT: 20.7 % — ABNORMAL LOW (ref 36.0–46.0)
Hemoglobin: 6.4 g/dL — CL (ref 12.0–15.0)
MCH: 25.9 pg — ABNORMAL LOW (ref 26.0–34.0)
MCHC: 30.9 g/dL (ref 30.0–36.0)
MCV: 83.8 fL (ref 80.0–100.0)
Platelets: 214 10*3/uL (ref 150–400)
RBC: 2.47 MIL/uL — ABNORMAL LOW (ref 3.87–5.11)
RDW: 15.9 % — ABNORMAL HIGH (ref 11.5–15.5)
WBC: 10.4 10*3/uL (ref 4.0–10.5)
nRBC: 0 % (ref 0.0–0.2)

## 2018-05-14 LAB — CBC WITH DIFFERENTIAL/PLATELET
Band Neutrophils: 0 %
Basophils Absolute: 0 10*3/uL (ref 0.0–0.1)
Basophils Relative: 0 %
Blasts: 0 %
Eosinophils Absolute: 0 10*3/uL (ref 0.0–0.5)
Eosinophils Relative: 0 %
HCT: 30.1 % — ABNORMAL LOW (ref 36.0–46.0)
Hemoglobin: 10.2 g/dL — ABNORMAL LOW (ref 12.0–15.0)
Lymphocytes Relative: 6 %
Lymphs Abs: 0.8 10*3/uL (ref 0.7–4.0)
MCH: 27.4 pg (ref 26.0–34.0)
MCHC: 33.9 g/dL (ref 30.0–36.0)
MCV: 80.9 fL (ref 80.0–100.0)
Metamyelocytes Relative: 0 %
Monocytes Absolute: 0.1 10*3/uL (ref 0.1–1.0)
Monocytes Relative: 1 %
Myelocytes: 0 %
Neutro Abs: 13.1 10*3/uL — ABNORMAL HIGH (ref 1.7–7.7)
Neutrophils Relative %: 93 %
Other: 0 %
Platelets: 225 10*3/uL (ref 150–400)
Promyelocytes Relative: 0 %
RBC: 3.72 MIL/uL — ABNORMAL LOW (ref 3.87–5.11)
RDW: 15.8 % — ABNORMAL HIGH (ref 11.5–15.5)
WBC: 14 10*3/uL — ABNORMAL HIGH (ref 4.0–10.5)
nRBC: 0 % (ref 0.0–0.2)
nRBC: 0 /100 WBC

## 2018-05-14 LAB — RENAL FUNCTION PANEL
Albumin: 1.1 g/dL — ABNORMAL LOW (ref 3.5–5.0)
Anion gap: 15 (ref 5–15)
BUN: 134 mg/dL — ABNORMAL HIGH (ref 8–23)
CO2: 21 mmol/L — ABNORMAL LOW (ref 22–32)
Calcium: 8.7 mg/dL — ABNORMAL LOW (ref 8.9–10.3)
Chloride: 102 mmol/L (ref 98–111)
Creatinine, Ser: 3.18 mg/dL — ABNORMAL HIGH (ref 0.44–1.00)
GFR calc Af Amer: 15 mL/min — ABNORMAL LOW (ref >60)
GFR calc non Af Amer: 13 mL/min — ABNORMAL LOW (ref >60)
Glucose, Bld: 157 mg/dL — ABNORMAL HIGH (ref 70–99)
Phosphorus: 2.1 mg/dL — ABNORMAL LOW (ref 2.5–4.6)
Potassium: 3.5 mmol/L (ref 3.5–5.1)
Sodium: 138 mmol/L (ref 135–145)

## 2018-05-14 LAB — ABO/RH: ABO/RH(D): A POS

## 2018-05-14 LAB — PREPARE RBC (CROSSMATCH)

## 2018-05-15 LAB — BPAM RBC
Blood Product Expiration Date: 202004272359
ISSUE DATE / TIME: 202004211253
Unit Type and Rh: 6200

## 2018-05-15 LAB — TYPE AND SCREEN
ABO/RH(D): A POS
Antibody Screen: NEGATIVE
Unit division: 0

## 2018-05-15 LAB — CULTURE, BLOOD (ROUTINE X 2): Special Requests: ADEQUATE

## 2018-05-15 NOTE — Progress Notes (Signed)
[facility]  ROUNDING NOTE   Subjective:  Patient remains critically ill appearing. Had dialysis yesterday.   Objective:  Vital signs in last 24 hours:  Temperature 99.2 pulse 108 respirations 38 blood pressure 90/43  Physical Exam: General: Critically ill-appearing female  Head: Normocephalic, atraumatic. Moist oral mucosal membranes  Eyes: Anicteric  Neck: Supple, trachea midline  Lungs:  Scattered rhonchi  Heart: S1S2 no rubs  Abdomen:  Soft, nontender, bowel sounds present  Extremities: 1+ peripheral edema.  Neurologic: Lethargic but arousable  Skin: No lesions       Basic Metabolic Panel: Recent Labs  Lab 05/08/18 1630 05/09/18 0544 05/11/18 0759 05/14/18 0540  NA 144 141 140 138  K 4.2 3.6 3.4* 3.5  CL 113* 108 106 102  CO2 16* 19* 21* 21*  GLUCOSE 212* 257* 176* 157*  BUN 177* 137* 131* 134*  CREATININE 4.67* 3.77* 3.20* 3.18*  CALCIUM 9.7 9.1 9.1 8.7*  PHOS 3.4 2.8 2.4* 2.1*    Liver Function Tests: Recent Labs  Lab 05/08/18 1630 05/09/18 0544 05/11/18 0759 05/14/18 0540  ALBUMIN 1.6* 1.3* 1.3* 1.1*   No results for input(s): LIPASE, AMYLASE in the last 168 hours. No results for input(s): AMMONIA in the last 168 hours.  CBC: Recent Labs  Lab 05/08/18 1630 05/09/18 0544 05/11/18 0759 05/14/18 0540 05/14/18 1839  WBC 12.4* 10.5 10.3 10.4 14.0*  NEUTROABS  --   --   --   --  13.1*  HGB 8.7* 8.0* 7.1* 6.4* 10.2*  HCT 28.2* 24.7* 22.2* 20.7* 30.1*  MCV 84.4 83.2 83.5 83.8 80.9  PLT 236 187 192 214 225    Cardiac Enzymes: No results for input(s): CKTOTAL, CKMB, CKMBINDEX, TROPONINI in the last 168 hours.  BNP: Invalid input(s): POCBNP  CBG: No results for input(s): GLUCAP in the last 168 hours.  Microbiology: Results for orders placed or performed during the hospital encounter of 05/12/2018  Culture, blood (routine x 2)     Status: Abnormal   Collection Time: 05/12/18  6:20 PM  Result Value Ref Range Status   Specimen  Description BLOOD LEFT HAND  Final   Special Requests AEROBIC BOTTLE ONLY Blood Culture adequate volume  Final   Culture  Setup Time   Final    GRAM POSITIVE RODS AEROBIC BOTTLE ONLY CRITICAL RESULT CALLED TO, READ BACK BY AND VERIFIED WITH: Megan Salon RN 12:05 05/14/18 ([email or web address])    Culture (A)  Final    LACTOBACILLUS SPECIES Standardized susceptibility testing for this organism is not available.    Report Status 05/15/2018 FINAL  Final  Blood Culture ID Panel (Reflexed)     Status: None   Collection Time: 05/12/18  6:20 PM  Result Value Ref Range Status   Enterococcus species NOT DETECTED NOT DETECTED Final   Listeria monocytogenes NOT DETECTED NOT DETECTED Final   Staphylococcus species NOT DETECTED NOT DETECTED Final   Staphylococcus aureus (BCID) NOT DETECTED NOT DETECTED Final   Streptococcus species NOT DETECTED NOT DETECTED Final   Streptococcus agalactiae NOT DETECTED NOT DETECTED Final   Streptococcus pneumoniae NOT DETECTED NOT DETECTED Final   Streptococcus pyogenes NOT DETECTED NOT DETECTED Final   Acinetobacter baumannii NOT DETECTED NOT DETECTED Final   Enterobacteriaceae species NOT DETECTED NOT DETECTED Final   Enterobacter cloacae complex NOT DETECTED NOT DETECTED Final   Escherichia coli NOT DETECTED NOT DETECTED Final   Klebsiella oxytoca NOT DETECTED NOT DETECTED Final   Klebsiella pneumoniae NOT DETECTED NOT DETECTED Final   Proteus species  NOT DETECTED NOT DETECTED Final   Serratia marcescens NOT DETECTED NOT DETECTED Final   Haemophilus influenzae NOT DETECTED NOT DETECTED Final   Neisseria meningitidis NOT DETECTED NOT DETECTED Final   Pseudomonas aeruginosa NOT DETECTED NOT DETECTED Final   Candida albicans NOT DETECTED NOT DETECTED Final   Candida glabrata NOT DETECTED NOT DETECTED Final   Candida krusei NOT DETECTED NOT DETECTED Final   Candida parapsilosis NOT DETECTED NOT DETECTED Final   Candida tropicalis NOT DETECTED NOT DETECTED Final    Comment:  Performed at [facility], [place] [address], [place], [place] [address]  Culture, blood (routine x 2)     Status: None (Preliminary result)   Collection Time: 05/12/18  6:30 PM  Result Value Ref Range Status   Specimen Description BLOOD RIGHT HAND  Final   Special Requests AEROBIC BOTTLE ONLY Blood Culture adequate volume  Final   Culture   Final    NO GROWTH 3 DAYS Performed at [facility], [place] [address] [place], [place] [address]    Report Status PENDING  Incomplete    Coagulation Studies: No results for input(s): LABPROT, INR in the last 72 hours.  Urinalysis: No results for input(s): COLORURINE, LABSPEC, PHURINE, GLUCOSEU, HGBUR, BILIRUBINUR, KETONESUR, PROTEINUR, UROBILINOGEN, NITRITE, LEUKOCYTESUR in the last 72 hours.  Invalid input(s): APPERANCEUR    Imaging: Dg Abd Portable 1v  Result Date: 05/13/2018 CLINICAL DATA:  NG tube placement EXAM: PORTABLE ABDOMEN - 1 VIEW COMPARISON:  05/13/2018 FINDINGS: Feeding tube tip is in the distal stomach directed toward the pylorus. IMPRESSION: Feeding tube tip in the distal stomach. Electronically Signed   By: Rolm Baptise M.D.   On: 05/13/2018 22:38   Dg Abd Portable 1v  Result Date: 05/13/2018 CLINICAL DATA:  Feeding tube placement. EXAM: PORTABLE ABDOMEN - 1 VIEW COMPARISON:  None. FINDINGS: A small bore feeding tube is identified with tip overlying the mid-distal stomach. IMPRESSION: Feeding tube with tip overlying the mid-distal stomach. Electronically Signed   By: Margarette Canada M.D.   On: 05/13/2018 17:00     Medications:       Assessment/ Plan:  85 y.o. female with a PMHx of stage IV sacral decubitus ulcer, failure to thrive, GERD, immobility, severe protein calorie malnutrition, recent acute renal failure, MGUS, diabetes mellitus type 2, hypertension, who was admitted to [facility] on 05/05/2018 for ongoing treatment of Candida glabrata sepsis, pancolitis with gastritis, altered mental status, failure to thrive,  protein calorie malnutrition, recurrent aspiration, and acute renal failure.  1.  Acute renal failure. 2.  Chronic kidney disease stage IV. 3.  Altered mental status. 4.  Failure to thrive. 5.  Severe protein calorie malnutrition. 6.  Acidosis. 7.  Anemia of chronic kidney disease.  Plan:   Patient remains critically ill without significant improvement in her condition after dialysis.  We will plan for another dialysis session today.  Previously we discussed with the patient's son that we would attempt a time-limited trial of dialysis treatments.  Consider ongoing discussions regarding palliative care with the patient's family.  Overall guarded prognosis.   LOS: 0 Shadai Mcclane 4/22/20203:14 PM

## 2018-05-16 LAB — RENAL FUNCTION PANEL
Albumin: 1.2 g/dL — ABNORMAL LOW (ref 3.5–5.0)
Anion gap: 13 (ref 5–15)
BUN: 113 mg/dL — ABNORMAL HIGH (ref 8–23)
CO2: 22 mmol/L (ref 22–32)
Calcium: 9.1 mg/dL (ref 8.9–10.3)
Chloride: 103 mmol/L (ref 98–111)
Creatinine, Ser: 2.37 mg/dL — ABNORMAL HIGH (ref 0.44–1.00)
GFR calc Af Amer: 21 mL/min — ABNORMAL LOW (ref >60)
GFR calc non Af Amer: 18 mL/min — ABNORMAL LOW (ref >60)
Glucose, Bld: 120 mg/dL — ABNORMAL HIGH (ref 70–99)
Phosphorus: <1 mg/dL — CL (ref 2.5–4.6)
Potassium: 3.4 mmol/L — ABNORMAL LOW (ref 3.5–5.1)
Sodium: 138 mmol/L (ref 135–145)

## 2018-05-16 LAB — CBC
HCT: 29.1 % — ABNORMAL LOW (ref 36.0–46.0)
Hemoglobin: 9.6 g/dL — ABNORMAL LOW (ref 12.0–15.0)
MCH: 27 pg (ref 26.0–34.0)
MCHC: 33 g/dL (ref 30.0–36.0)
MCV: 82 fL (ref 80.0–100.0)
Platelets: 243 10*3/uL (ref 150–400)
RBC: 3.55 MIL/uL — ABNORMAL LOW (ref 3.87–5.11)
RDW: 16.3 % — ABNORMAL HIGH (ref 11.5–15.5)
WBC: 14.4 10*3/uL — ABNORMAL HIGH (ref 4.0–10.5)
nRBC: 0 % (ref 0.0–0.2)

## 2018-05-17 LAB — CULTURE, BLOOD (ROUTINE X 2)
Culture: NO GROWTH
Special Requests: ADEQUATE

## 2018-05-17 NOTE — Progress Notes (Signed)
[facility]  ROUNDING NOTE   Subjective:  Patient remains critically ill. She is lethargic but arousable. Still getting tube feeds. No significant improvement in overall status however.   Objective:  Vital signs in last 24 hours:  Temperature 98.6 pulse 94 respirations 38 blood pressure 117/68  Physical Exam: General: Critically ill-appearing female  Head: Normocephalic, atraumatic. Moist oral mucosal membranes  Eyes: Anicteric  Neck: Supple, trachea midline  Lungs:  Scattered rhonchi  Heart: S1S2 no rubs  Abdomen:  Soft, nontender, bowel sounds present  Extremities: 1+ peripheral edema.  Neurologic: Lethargic but arousable  Skin: No lesions       Basic Metabolic Panel: Recent Labs  Lab 05/11/18 0759 05/14/18 0540 05/16/18 0448  NA 140 138 138  K 3.4* 3.5 3.4*  CL 106 102 103  CO2 21* 21* 22  GLUCOSE 176* 157* 120*  BUN 131* 134* 113*  CREATININE 3.20* 3.18* 2.37*  CALCIUM 9.1 8.7* 9.1  PHOS 2.4* 2.1* <1.0*    Liver Function Tests: Recent Labs  Lab 05/11/18 0759 05/14/18 0540 05/16/18 0448  ALBUMIN 1.3* 1.1* 1.2*   No results for input(s): LIPASE, AMYLASE in the last 168 hours. No results for input(s): AMMONIA in the last 168 hours.  CBC: Recent Labs  Lab 05/11/18 0759 05/14/18 0540 05/14/18 1839 05/16/18 0448  WBC 10.3 10.4 14.0* 14.4*  NEUTROABS  --   --  13.1*  --   HGB 7.1* 6.4* 10.2* 9.6*  HCT 22.2* 20.7* 30.1* 29.1*  MCV 83.5 83.8 80.9 82.0  PLT 192 214 225 243    Cardiac Enzymes: No results for input(s): CKTOTAL, CKMB, CKMBINDEX, TROPONINI in the last 168 hours.  BNP: Invalid input(s): POCBNP  CBG: No results for input(s): GLUCAP in the last 168 hours.  Microbiology: Results for orders placed or performed during the hospital encounter of 05/05/2018  Culture, blood (routine x 2)     Status: Abnormal   Collection Time: 05/12/18  6:20 PM  Result Value Ref Range Status   Specimen Description BLOOD LEFT HAND  Final    Special Requests AEROBIC BOTTLE ONLY Blood Culture adequate volume  Final   Culture  Setup Time   Final    GRAM POSITIVE RODS AEROBIC BOTTLE ONLY CRITICAL RESULT CALLED TO, READ BACK BY AND VERIFIED WITH: Megan Salon RN 12:05 05/14/18 ([email or web address])    Culture (A)  Final    LACTOBACILLUS SPECIES Standardized susceptibility testing for this organism is not available.    Report Status 05/15/2018 FINAL  Final  Blood Culture ID Panel (Reflexed)     Status: None   Collection Time: 05/12/18  6:20 PM  Result Value Ref Range Status   Enterococcus species NOT DETECTED NOT DETECTED Final   Listeria monocytogenes NOT DETECTED NOT DETECTED Final   Staphylococcus species NOT DETECTED NOT DETECTED Final   Staphylococcus aureus (BCID) NOT DETECTED NOT DETECTED Final   Streptococcus species NOT DETECTED NOT DETECTED Final   Streptococcus agalactiae NOT DETECTED NOT DETECTED Final   Streptococcus pneumoniae NOT DETECTED NOT DETECTED Final   Streptococcus pyogenes NOT DETECTED NOT DETECTED Final   Acinetobacter baumannii NOT DETECTED NOT DETECTED Final   Enterobacteriaceae species NOT DETECTED NOT DETECTED Final   Enterobacter cloacae complex NOT DETECTED NOT DETECTED Final   Escherichia coli NOT DETECTED NOT DETECTED Final   Klebsiella oxytoca NOT DETECTED NOT DETECTED Final   Klebsiella pneumoniae NOT DETECTED NOT DETECTED Final   Proteus species NOT DETECTED NOT DETECTED Final   Serratia marcescens NOT DETECTED NOT  DETECTED Final   Haemophilus influenzae NOT DETECTED NOT DETECTED Final   Neisseria meningitidis NOT DETECTED NOT DETECTED Final   Pseudomonas aeruginosa NOT DETECTED NOT DETECTED Final   Candida albicans NOT DETECTED NOT DETECTED Final   Candida glabrata NOT DETECTED NOT DETECTED Final   Candida krusei NOT DETECTED NOT DETECTED Final   Candida parapsilosis NOT DETECTED NOT DETECTED Final   Candida tropicalis NOT DETECTED NOT DETECTED Final    Comment: Performed at [facility],  [place] [address] [place], [place] [address]  Culture, blood (routine x 2)     Status: None (Preliminary result)   Collection Time: 05/12/18  6:30 PM  Result Value Ref Range Status   Specimen Description BLOOD RIGHT HAND  Final   Special Requests AEROBIC BOTTLE ONLY Blood Culture adequate volume  Final   Culture   Final    NO GROWTH 4 DAYS Performed at [facility], [place] [address] [place], [place] [address]    Report Status PENDING  Incomplete    Coagulation Studies: No results for input(s): LABPROT, INR in the last 72 hours.  Urinalysis: No results for input(s): COLORURINE, LABSPEC, PHURINE, GLUCOSEU, HGBUR, BILIRUBINUR, KETONESUR, PROTEINUR, UROBILINOGEN, NITRITE, LEUKOCYTESUR in the last 72 hours.  Invalid input(s): APPERANCEUR    Imaging: No results found.   Medications:       Assessment/ Plan:  85 y.o. female with a PMHx of stage IV sacral decubitus ulcer, failure to thrive, GERD, immobility, severe protein calorie malnutrition, recent acute renal failure, MGUS, diabetes mellitus type 2, hypertension, who was admitted to [facility] on 05/18/2018 for ongoing treatment of Candida glabrata sepsis, pancolitis with gastritis, altered mental status, failure to thrive, protein calorie malnutrition, recurrent aspiration, and acute renal failure.  1.  Acute renal failure. 2.  Chronic kidney disease stage IV. 3.  Altered mental status. 4.  Failure to thrive. 5.  Severe protein calorie malnutrition. 6.  Acidosis. 7.  Anemia of chronic kidney disease.  Plan:   Patient seen and evaluated at bedside.  Remains critically ill at this time.  Lethargic but arousable.  No significant change in overall mental status or general condition.  Tolerating dialysis well.  We will plan for dialysis again tomorrow.  As before consider palliative care as he has not been significant improvement in her underlying condition.   LOS: 0 Haden Cavenaugh 4/24/20208:21 AM

## 2018-05-18 LAB — RENAL FUNCTION PANEL
Albumin: 1.3 g/dL — ABNORMAL LOW (ref 3.5–5.0)
Anion gap: 14 (ref 5–15)
BUN: 114 mg/dL — ABNORMAL HIGH (ref 8–23)
CO2: 21 mmol/L — ABNORMAL LOW (ref 22–32)
Calcium: 9 mg/dL (ref 8.9–10.3)
Chloride: 99 mmol/L (ref 98–111)
Creatinine, Ser: 2.23 mg/dL — ABNORMAL HIGH (ref 0.44–1.00)
GFR calc Af Amer: 22 mL/min — ABNORMAL LOW (ref >60)
GFR calc non Af Amer: 19 mL/min — ABNORMAL LOW (ref >60)
Glucose, Bld: 188 mg/dL — ABNORMAL HIGH (ref 70–99)
Phosphorus: 1.3 mg/dL — ABNORMAL LOW (ref 2.5–4.6)
Potassium: 3.5 mmol/L (ref 3.5–5.1)
Sodium: 134 mmol/L — ABNORMAL LOW (ref 135–145)

## 2018-05-18 LAB — CBC
HCT: 29.4 % — ABNORMAL LOW (ref 36.0–46.0)
Hemoglobin: 9.6 g/dL — ABNORMAL LOW (ref 12.0–15.0)
MCH: 27 pg (ref 26.0–34.0)
MCHC: 32.7 g/dL (ref 30.0–36.0)
MCV: 82.6 fL (ref 80.0–100.0)
Platelets: 278 10*3/uL (ref 150–400)
RBC: 3.56 MIL/uL — ABNORMAL LOW (ref 3.87–5.11)
RDW: 16.3 % — ABNORMAL HIGH (ref 11.5–15.5)
WBC: 14.6 10*3/uL — ABNORMAL HIGH (ref 4.0–10.5)
nRBC: 0 % (ref 0.0–0.2)

## 2018-05-20 LAB — BASIC METABOLIC PANEL
Anion gap: 11 (ref 5–15)
BUN: 134 mg/dL — ABNORMAL HIGH (ref 8–23)
CO2: 24 mmol/L (ref 22–32)
Calcium: 9.1 mg/dL (ref 8.9–10.3)
Chloride: 101 mmol/L (ref 98–111)
Creatinine, Ser: 2.51 mg/dL — ABNORMAL HIGH (ref 0.44–1.00)
GFR calc Af Amer: 19 mL/min — ABNORMAL LOW (ref >60)
GFR calc non Af Amer: 17 mL/min — ABNORMAL LOW (ref >60)
Glucose, Bld: 170 mg/dL — ABNORMAL HIGH (ref 70–99)
Potassium: 4 mmol/L (ref 3.5–5.1)
Sodium: 136 mmol/L (ref 135–145)

## 2018-05-20 LAB — CBC
HCT: 26 % — ABNORMAL LOW (ref 36.0–46.0)
Hemoglobin: 8.5 g/dL — ABNORMAL LOW (ref 12.0–15.0)
MCH: 26.9 pg (ref 26.0–34.0)
MCHC: 32.7 g/dL (ref 30.0–36.0)
MCV: 82.3 fL (ref 80.0–100.0)
Platelets: 300 10*3/uL (ref 150–400)
RBC: 3.16 MIL/uL — ABNORMAL LOW (ref 3.87–5.11)
RDW: 16.5 % — ABNORMAL HIGH (ref 11.5–15.5)
WBC: 17.7 10*3/uL — ABNORMAL HIGH (ref 4.0–10.5)
nRBC: 0 % (ref 0.0–0.2)

## 2018-05-20 NOTE — Progress Notes (Signed)
[facility]  ROUNDING NOTE   Subjective:  Patient remains critically ill. No significant change in mental status. Remains on tube feeds.   Objective:  Vital signs in last 24 hours:  Temperature 98.8 pulse 96 respirations 28 blood pressure 115/59  Physical Exam: General: Critically ill-appearing female  Head: Normocephalic, atraumatic. Moist oral mucosal membranes  Eyes: Anicteric  Neck: Supple, trachea midline  Lungs:  Scattered rhonchi  Heart: S1S2 no rubs  Abdomen:  Soft, nontender, bowel sounds present  Extremities: 1+ peripheral edema.  Neurologic: Lethargic but arousable  Skin: No lesions       Basic Metabolic Panel: Recent Labs  Lab 05/14/18 0540 05/16/18 0448 05/18/18 0517 05/20/18 0707  NA 138 138 134* 136  K 3.5 3.4* 3.5 4.0  CL 102 103 99 101  CO2 21* 22 21* 24  GLUCOSE 157* 120* 188* 170*  BUN 134* 113* 114* 134*  CREATININE 3.18* 2.37* 2.23* 2.51*  CALCIUM 8.7* 9.1 9.0 9.1  PHOS 2.1* <1.0* 1.3*  --     Liver Function Tests: Recent Labs  Lab 05/14/18 0540 05/16/18 0448 05/18/18 0517  ALBUMIN 1.1* 1.2* 1.3*   No results for input(s): LIPASE, AMYLASE in the last 168 hours. No results for input(s): AMMONIA in the last 168 hours.  CBC: Recent Labs  Lab 05/14/18 0540 05/14/18 1839 05/16/18 0448 05/18/18 0517 05/20/18 0707  WBC 10.4 14.0* 14.4* 14.6* 17.7*  NEUTROABS  --  13.1*  --   --   --   HGB 6.4* 10.2* 9.6* 9.6* 8.5*  HCT 20.7* 30.1* 29.1* 29.4* 26.0*  MCV 83.8 80.9 82.0 82.6 82.3  PLT 214 225 243 278 300    Cardiac Enzymes: No results for input(s): CKTOTAL, CKMB, CKMBINDEX, TROPONINI in the last 168 hours.  BNP: Invalid input(s): POCBNP  CBG: No results for input(s): GLUCAP in the last 168 hours.  Microbiology: Results for orders placed or performed during the hospital encounter of 05/10/2018  Culture, blood (routine x 2)     Status: Abnormal   Collection Time: 05/12/18  6:20 PM  Result Value Ref Range Status    Specimen Description BLOOD LEFT HAND  Final   Special Requests AEROBIC BOTTLE ONLY Blood Culture adequate volume  Final   Culture  Setup Time   Final    GRAM POSITIVE RODS AEROBIC BOTTLE ONLY CRITICAL RESULT CALLED TO, READ BACK BY AND VERIFIED WITH: Megan Salon RN 12:05 05/14/18 ([email or web address])    Culture (A)  Final    LACTOBACILLUS SPECIES Standardized susceptibility testing for this organism is not available.    Report Status 05/15/2018 FINAL  Final  Blood Culture ID Panel (Reflexed)     Status: None   Collection Time: 05/12/18  6:20 PM  Result Value Ref Range Status   Enterococcus species NOT DETECTED NOT DETECTED Final   Listeria monocytogenes NOT DETECTED NOT DETECTED Final   Staphylococcus species NOT DETECTED NOT DETECTED Final   Staphylococcus aureus (BCID) NOT DETECTED NOT DETECTED Final   Streptococcus species NOT DETECTED NOT DETECTED Final   Streptococcus agalactiae NOT DETECTED NOT DETECTED Final   Streptococcus pneumoniae NOT DETECTED NOT DETECTED Final   Streptococcus pyogenes NOT DETECTED NOT DETECTED Final   Acinetobacter baumannii NOT DETECTED NOT DETECTED Final   Enterobacteriaceae species NOT DETECTED NOT DETECTED Final   Enterobacter cloacae complex NOT DETECTED NOT DETECTED Final   Escherichia coli NOT DETECTED NOT DETECTED Final   Klebsiella oxytoca NOT DETECTED NOT DETECTED Final   Klebsiella pneumoniae NOT DETECTED NOT DETECTED  Final   Proteus species NOT DETECTED NOT DETECTED Final   Serratia marcescens NOT DETECTED NOT DETECTED Final   Haemophilus influenzae NOT DETECTED NOT DETECTED Final   Neisseria meningitidis NOT DETECTED NOT DETECTED Final   Pseudomonas aeruginosa NOT DETECTED NOT DETECTED Final   Candida albicans NOT DETECTED NOT DETECTED Final   Candida glabrata NOT DETECTED NOT DETECTED Final   Candida krusei NOT DETECTED NOT DETECTED Final   Candida parapsilosis NOT DETECTED NOT DETECTED Final   Candida tropicalis NOT DETECTED NOT DETECTED Final     Comment: Performed at [facility], [place] [address], [place], [place] [address]  Culture, blood (routine x 2)     Status: None   Collection Time: 05/12/18  6:30 PM  Result Value Ref Range Status   Specimen Description BLOOD RIGHT HAND  Final   Special Requests AEROBIC BOTTLE ONLY Blood Culture adequate volume  Final   Culture   Final    NO GROWTH 5 DAYS Performed at [facility], [place] [address] [place], [place] [address]    Report Status 05/17/2018 FINAL  Final    Coagulation Studies: No results for input(s): LABPROT, INR in the last 72 hours.  Urinalysis: No results for input(s): COLORURINE, LABSPEC, PHURINE, GLUCOSEU, HGBUR, BILIRUBINUR, KETONESUR, PROTEINUR, UROBILINOGEN, NITRITE, LEUKOCYTESUR in the last 72 hours.  Invalid input(s): APPERANCEUR    Imaging: No results found.   Medications:       Assessment/ Plan:  85 y.o. female with a PMHx of stage IV sacral decubitus ulcer, failure to thrive, GERD, immobility, severe protein calorie malnutrition, recent acute renal failure, MGUS, diabetes mellitus type 2, hypertension, who was admitted to [facility] on 05/03/2018 for ongoing treatment of Candida glabrata sepsis, pancolitis with gastritis, altered mental status, failure to thrive, protein calorie malnutrition, recurrent aspiration, and acute renal failure.  1.  Acute renal failure. 2.  Chronic kidney disease stage IV. 3.  Altered mental status. 4.  Failure to thrive. 5.  Severe protein calorie malnutrition. 6.  Acidosis. 7.  Anemia of chronic kidney disease.  Plan:   Patient remains critically ill without significant improvement in her underlying medical condition.  BUN currently 134 with a creatinine of 2.51.  We will plan for dialysis again tomorrow.  Recommend that primary team have discussion was on goals of care with the patient's family.  Patient would not be a good candidate for long-term dialysis given her underlying comorbidities including sacral  decubitus ulcer, failure to thrive, protein calorie pneumonia patient, and diabetes mellitus management.   LOS: 0 Simuel Stebner 4/27/20202:37 PM

## 2018-05-21 LAB — RENAL FUNCTION PANEL
Albumin: 1.5 g/dL — ABNORMAL LOW (ref 3.5–5.0)
Anion gap: 12 (ref 5–15)
BUN: 171 mg/dL — ABNORMAL HIGH (ref 8–23)
CO2: 22 mmol/L (ref 22–32)
Calcium: 9.3 mg/dL (ref 8.9–10.3)
Chloride: 98 mmol/L (ref 98–111)
Creatinine, Ser: 2.89 mg/dL — ABNORMAL HIGH (ref 0.44–1.00)
GFR calc Af Amer: 16 mL/min — ABNORMAL LOW (ref >60)
GFR calc non Af Amer: 14 mL/min — ABNORMAL LOW (ref >60)
Glucose, Bld: 191 mg/dL — ABNORMAL HIGH (ref 70–99)
Phosphorus: <1 mg/dL — CL (ref 2.5–4.6)
Potassium: 3.7 mmol/L (ref 3.5–5.1)
Sodium: 132 mmol/L — ABNORMAL LOW (ref 135–145)

## 2018-05-21 LAB — CBC
HCT: 25.7 % — ABNORMAL LOW (ref 36.0–46.0)
Hemoglobin: 8.5 g/dL — ABNORMAL LOW (ref 12.0–15.0)
MCH: 27 pg (ref 26.0–34.0)
MCHC: 33.1 g/dL (ref 30.0–36.0)
MCV: 81.6 fL (ref 80.0–100.0)
Platelets: 328 10*3/uL (ref 150–400)
RBC: 3.15 MIL/uL — ABNORMAL LOW (ref 3.87–5.11)
RDW: 16.5 % — ABNORMAL HIGH (ref 11.5–15.5)
WBC: 16.6 10*3/uL — ABNORMAL HIGH (ref 4.0–10.5)
nRBC: 0 % (ref 0.0–0.2)

## 2018-05-22 ENCOUNTER — Other Ambulatory Visit (HOSPITAL_COMMUNITY): Payer: Medicare Other

## 2018-05-22 LAB — BLOOD GAS, ARTERIAL
Acid-Base Excess: 0.2 mmol/L (ref 0.0–2.0)
Acid-base deficit: 0.4 mmol/L (ref 0.0–2.0)
Bicarbonate: 22.5 mmol/L (ref 20.0–28.0)
Bicarbonate: 22.3 mmol/L (ref 20.0–28.0)
Delivery systems: POSITIVE
Expiratory PAP: 10
FIO2: 0.1
FIO2: 100
Inspiratory PAP: 16
O2 Saturation: 78.6 %
O2 Saturation: 99.4 %
Patient temperature: 98.6
Patient temperature: 98.6
RATE: 12 resp/min
pCO2 arterial: 25.8 mmHg — ABNORMAL LOW (ref 32.0–48.0)
pCO2 arterial: 27.8 mmHg — ABNORMAL LOW (ref 32.0–48.0)
pH, Arterial: 7.55 — ABNORMAL HIGH (ref 7.350–7.450)
pH, Arterial: 7.515 — ABNORMAL HIGH (ref 7.350–7.450)
pO2, Arterial: 35.8 mmHg — CL (ref 83.0–108.0)
pO2, Arterial: 176 mmHg — ABNORMAL HIGH (ref 83.0–108.0)

## 2018-05-22 NOTE — Progress Notes (Signed)
[facility]  ROUNDING NOTE   Subjective:  Patient remains quite lethargic when seen at bedside. She is arousable but nonverbal. No significant change in her mental status since she has started dialysis. She still requires deep oral suctioning.   Objective:  Vital signs in last 24 hours:  Temperature 97 pulse 95 respirations 38 blood pressure 117/54 urine output 700 cc over 24 hours  Physical Exam: General: Critically ill-appearing female  Head: Normocephalic, atraumatic. Moist oral mucosal membranes  Eyes: Anicteric  Neck: Supple, trachea midline  Lungs:  Scattered rhonchi  Heart: S1S2 no rubs  Abdomen:  Soft, nontender, bowel sounds present  Extremities: 1+ peripheral edema.  Neurologic: Lethargic but arousable  Skin: No lesions       Basic Metabolic Panel: Recent Labs  Lab 05/16/18 0448 05/18/18 0517 05/20/18 0707 05/21/18 0532  NA 138 134* 136 132*  K 3.4* 3.5 4.0 3.7  CL 103 99 101 98  CO2 22 21* 24 22  GLUCOSE 120* 188* 170* 191*  BUN 113* 114* 134* 171*  CREATININE 2.37* 2.23* 2.51* 2.89*  CALCIUM 9.1 9.0 9.1 9.3  PHOS <1.0* 1.3*  --  <1.0*    Liver Function Tests: Recent Labs  Lab 05/16/18 0448 05/18/18 0517 05/21/18 0532  ALBUMIN 1.2* 1.3* 1.5*   No results for input(s): LIPASE, AMYLASE in the last 168 hours. No results for input(s): AMMONIA in the last 168 hours.  CBC: Recent Labs  Lab 05/16/18 0448 05/18/18 0517 05/20/18 0707 05/21/18 0532  WBC 14.4* 14.6* 17.7* 16.6*  HGB 9.6* 9.6* 8.5* 8.5*  HCT 29.1* 29.4* 26.0* 25.7*  MCV 82.0 82.6 82.3 81.6  PLT 243 278 300 328    Cardiac Enzymes: No results for input(s): CKTOTAL, CKMB, CKMBINDEX, TROPONINI in the last 168 hours.  BNP: Invalid input(s): POCBNP  CBG: No results for input(s): GLUCAP in the last 168 hours.  Microbiology: Results for orders placed or performed during the hospital encounter of 05/05/2018  Culture, blood (routine x 2)     Status: Abnormal   Collection Time: 05/12/18  6:20 PM  Result Value Ref Range Status   Specimen Description BLOOD LEFT HAND  Final   Special Requests AEROBIC BOTTLE ONLY Blood Culture adequate volume  Final   Culture  Setup Time   Final    GRAM POSITIVE RODS AEROBIC BOTTLE ONLY CRITICAL RESULT CALLED TO, READ BACK BY AND VERIFIED WITH: Megan Salon RN 12:05 05/14/18 ([email or web address])    Culture (A)  Final    LACTOBACILLUS SPECIES Standardized susceptibility testing for this organism is not available.    Report Status 05/15/2018 FINAL  Final  Blood Culture ID Panel (Reflexed)     Status: None   Collection Time: 05/12/18  6:20 PM  Result Value Ref Range Status   Enterococcus species NOT DETECTED NOT DETECTED Final   Listeria monocytogenes NOT DETECTED NOT DETECTED Final   Staphylococcus species NOT DETECTED NOT DETECTED Final   Staphylococcus aureus (BCID) NOT DETECTED NOT DETECTED Final   Streptococcus species NOT DETECTED NOT DETECTED Final   Streptococcus agalactiae NOT DETECTED NOT DETECTED Final   Streptococcus pneumoniae NOT DETECTED NOT DETECTED Final   Streptococcus pyogenes NOT DETECTED NOT DETECTED Final   Acinetobacter baumannii NOT DETECTED NOT DETECTED Final   Enterobacteriaceae species NOT DETECTED NOT DETECTED Final   Enterobacter cloacae complex NOT DETECTED NOT DETECTED Final   Escherichia coli NOT DETECTED NOT DETECTED Final   Klebsiella oxytoca NOT DETECTED NOT DETECTED Final   Klebsiella pneumoniae NOT DETECTED NOT  DETECTED Final   Proteus species NOT DETECTED NOT DETECTED Final   Serratia marcescens NOT DETECTED NOT DETECTED Final   Haemophilus influenzae NOT DETECTED NOT DETECTED Final   Neisseria meningitidis NOT DETECTED NOT DETECTED Final   Pseudomonas aeruginosa NOT DETECTED NOT DETECTED Final   Candida albicans NOT DETECTED NOT DETECTED Final   Candida glabrata NOT DETECTED NOT DETECTED Final   Candida krusei NOT DETECTED NOT DETECTED Final   Candida parapsilosis NOT DETECTED NOT  DETECTED Final   Candida tropicalis NOT DETECTED NOT DETECTED Final    Comment: Performed at [facility], [place] [address] [place], [place] [address]  Culture, blood (routine x 2)     Status: None   Collection Time: 05/12/18  6:30 PM  Result Value Ref Range Status   Specimen Description BLOOD RIGHT HAND  Final   Special Requests AEROBIC BOTTLE ONLY Blood Culture adequate volume  Final   Culture   Final    NO GROWTH 5 DAYS Performed at [facility], [place] [address] [place], [place] [address]    Report Status 05/17/2018 FINAL  Final    Coagulation Studies: No results for input(s): LABPROT, INR in the last 72 hours.  Urinalysis: No results for input(s): COLORURINE, LABSPEC, PHURINE, GLUCOSEU, HGBUR, BILIRUBINUR, KETONESUR, PROTEINUR, UROBILINOGEN, NITRITE, LEUKOCYTESUR in the last 72 hours.  Invalid input(s): APPERANCEUR    Imaging: Dg Abd 1 View  Result Date: 05/22/2018 CLINICAL DATA:  NG tube placement. EXAM: ABDOMEN - 1 VIEW COMPARISON:  05/13/2018 FINDINGS: Enteric tube has tip just right of midline in the mid abdomen likely over the distal stomach. Bowel gas pattern is nonobstructive. Remainder the exam is unchanged. IMPRESSION: Nonobstructive bowel gas pattern. Enteric tube with tip just right of midline in the mid abdomen likely over the distal stomach. Electronically Signed   By: Marin Olp M.D.   On: 05/22/2018 14:13     Medications:       Assessment/ Plan:  85 y.o. female with a PMHx of stage IV sacral decubitus ulcer, failure to thrive, GERD, immobility, severe protein calorie malnutrition, recent acute renal failure, MGUS, diabetes mellitus type 2, hypertension, who was admitted to [facility] on 05/10/2018 for ongoing treatment of Candida glabrata sepsis, pancolitis with gastritis, altered mental status, failure to thrive, protein calorie malnutrition, recurrent aspiration, and acute renal failure.  1.  Acute renal failure. 2.  Chronic kidney disease stage  IV. 3.  Altered mental status. 4.  Failure to thrive. 5.  Severe protein calorie malnutrition. 6.  Acidosis. 7.  Anemia of chronic kidney disease.  Plan:   Patient's general medical condition is currently unchanged.  She remains critically ill.  She is lethargic but arousable and nonverbal.  No significant change in her mental status noted with the initiation of dialysis.  This was discussed with Dr. Owens Shark today.  Recommend consideration of hospice/comfort care.  Until final decision is made regarding her disposition we will maintain dialysis treatment.  She will be due for another dialysis treatment tomorrow.   LOS: 0 Lori Mack 4/29/20202:55 PM

## 2018-05-23 ENCOUNTER — Encounter (HOSPITAL_COMMUNITY): Payer: Self-pay | Admitting: Diagnostic Radiology

## 2018-05-23 ENCOUNTER — Other Ambulatory Visit (HOSPITAL_COMMUNITY): Payer: Medicare Other

## 2018-05-23 HISTORY — PX: IR FLUORO GUIDE CV LINE LEFT: IMG2282

## 2018-05-23 LAB — RENAL FUNCTION PANEL
Albumin: 1.7 g/dL — ABNORMAL LOW (ref 3.5–5.0)
Anion gap: 13 (ref 5–15)
BUN: 131 mg/dL — ABNORMAL HIGH (ref 8–23)
CO2: 21 mmol/L — ABNORMAL LOW (ref 22–32)
Calcium: 9.1 mg/dL (ref 8.9–10.3)
Chloride: 98 mmol/L (ref 98–111)
Creatinine, Ser: 2.54 mg/dL — ABNORMAL HIGH (ref 0.44–1.00)
GFR calc Af Amer: 19 mL/min — ABNORMAL LOW (ref >60)
GFR calc non Af Amer: 17 mL/min — ABNORMAL LOW (ref >60)
Glucose, Bld: 87 mg/dL (ref 70–99)
Phosphorus: 1.7 mg/dL — ABNORMAL LOW (ref 2.5–4.6)
Potassium: 3.4 mmol/L — ABNORMAL LOW (ref 3.5–5.1)
Sodium: 132 mmol/L — ABNORMAL LOW (ref 135–145)

## 2018-05-23 LAB — CBC
HCT: 27 % — ABNORMAL LOW (ref 36.0–46.0)
Hemoglobin: 9 g/dL — ABNORMAL LOW (ref 12.0–15.0)
MCH: 26.6 pg (ref 26.0–34.0)
MCHC: 33.3 g/dL (ref 30.0–36.0)
MCV: 79.9 fL — ABNORMAL LOW (ref 80.0–100.0)
Platelets: 319 10*3/uL (ref 150–400)
RBC: 3.38 MIL/uL — ABNORMAL LOW (ref 3.87–5.11)
RDW: 16.1 % — ABNORMAL HIGH (ref 11.5–15.5)
WBC: 19.9 10*3/uL — ABNORMAL HIGH (ref 4.0–10.5)
nRBC: 0 % (ref 0.0–0.2)

## 2018-05-23 MED ORDER — HEPARIN SODIUM (PORCINE) 1000 UNIT/ML IJ SOLN
INTRAMUSCULAR | Status: AC
  Filled 2018-05-23: qty 1

## 2018-05-23 MED ORDER — LIDOCAINE HCL 1 % IJ SOLN
INTRAMUSCULAR | Status: DC | PRN
  Administered 2018-05-23: 5 mL

## 2018-05-23 MED ORDER — HEPARIN SODIUM (PORCINE) 1000 UNIT/ML IJ SOLN
INTRAMUSCULAR | Status: DC | PRN
  Administered 2018-05-23: 2800 [IU] via INTRAVENOUS

## 2018-05-23 MED ORDER — LIDOCAINE HCL 1 % IJ SOLN
INTRAMUSCULAR | Status: AC
  Filled 2018-05-23: qty 20

## 2018-05-23 MED ORDER — CHLORHEXIDINE GLUCONATE 4 % EX LIQD
CUTANEOUS | Status: AC
  Filled 2018-05-23: qty 15

## 2018-05-23 MED ORDER — CHLORHEXIDINE GLUCONATE 4 % EX LIQD
CUTANEOUS | Status: DC | PRN
  Administered 2018-05-23: 1 via TOPICAL

## 2018-05-23 NOTE — Procedures (Signed)
Interventional Radiology Procedure:   Indications:  Malpositioned central line  Procedure: Central line exchange.   Findings: 20 cm Triple lumen Dialysis cathter exchanged, tip at SVC/RA junction  Complications: None     EBL: Less than 10 ml  Plan: Catheter is ready to use.    Helen Cuff R. Anselm Pancoast, MD  Pager: [phone]

## 2018-05-24 NOTE — Progress Notes (Signed)
[facility]  ROUNDING NOTE   Subjective:  Patient underwent dialysis catheter exchange yesterday. Therefore she did not receive her dialysis treatment. Due for another dialysis treatment today.  Objective:  Vital signs in last 24 hours:  Temperature 97.6 pulse 89 respiration 30 blood pressure 106/57  Physical Exam: General: Critically ill-appearing female  Head: Normocephalic, atraumatic. Moist oral mucosal membranes  Eyes: Anicteric  Neck: Supple, trachea midline  Lungs:  Scattered rhonchi  Heart: S1S2 no rubs  Abdomen:  Soft, nontender, bowel sounds present  Extremities: 1+ peripheral edema.  Neurologic: Lethargic but arousable  Skin: No lesions       Basic Metabolic Panel: Recent Labs  Lab 05/18/18 0517 05/20/18 0707 05/21/18 0532 05/23/18 0500  NA 134* 136 132* 132*  K 3.5 4.0 3.7 3.4*  CL 99 101 98 98  CO2 21* 24 22 21*  GLUCOSE 188* 170* 191* 87  BUN 114* 134* 171* 131*  CREATININE 2.23* 2.51* 2.89* 2.54*  CALCIUM 9.0 9.1 9.3 9.1  PHOS 1.3*  --  <1.0* 1.7*    Liver Function Tests: Recent Labs  Lab 05/18/18 0517 05/21/18 0532 05/23/18 0500  ALBUMIN 1.3* 1.5* 1.7*   No results for input(s): LIPASE, AMYLASE in the last 168 hours. No results for input(s): AMMONIA in the last 168 hours.  CBC: Recent Labs  Lab 05/18/18 0517 05/20/18 0707 05/21/18 0532 05/23/18 0500  WBC 14.6* 17.7* 16.6* 19.9*  HGB 9.6* 8.5* 8.5* 9.0*  HCT 29.4* 26.0* 25.7* 27.0*  MCV 82.6 82.3 81.6 79.9*  PLT 278 300 328 319    Cardiac Enzymes: No results for input(s): CKTOTAL, CKMB, CKMBINDEX, TROPONINI in the last 168 hours.  BNP: Invalid input(s): POCBNP  CBG: No results for input(s): GLUCAP in the last 168 hours.  Microbiology: Results for orders placed or performed during the hospital encounter of 05/10/2018  Culture, blood (routine x 2)     Status: Abnormal   Collection Time: 05/12/18  6:20 PM  Result Value Ref Range Status   Specimen Description  BLOOD LEFT HAND  Final   Special Requests AEROBIC BOTTLE ONLY Blood Culture adequate volume  Final   Culture  Setup Time   Final    GRAM POSITIVE RODS AEROBIC BOTTLE ONLY CRITICAL RESULT CALLED TO, READ BACK BY AND VERIFIED WITH: Megan Salon RN 12:05 05/14/18 ([email or web address])    Culture (A)  Final    LACTOBACILLUS SPECIES Standardized susceptibility testing for this organism is not available.    Report Status 05/15/2018 FINAL  Final  Blood Culture ID Panel (Reflexed)     Status: None   Collection Time: 05/12/18  6:20 PM  Result Value Ref Range Status   Enterococcus species NOT DETECTED NOT DETECTED Final   Listeria monocytogenes NOT DETECTED NOT DETECTED Final   Staphylococcus species NOT DETECTED NOT DETECTED Final   Staphylococcus aureus (BCID) NOT DETECTED NOT DETECTED Final   Streptococcus species NOT DETECTED NOT DETECTED Final   Streptococcus agalactiae NOT DETECTED NOT DETECTED Final   Streptococcus pneumoniae NOT DETECTED NOT DETECTED Final   Streptococcus pyogenes NOT DETECTED NOT DETECTED Final   Acinetobacter baumannii NOT DETECTED NOT DETECTED Final   Enterobacteriaceae species NOT DETECTED NOT DETECTED Final   Enterobacter cloacae complex NOT DETECTED NOT DETECTED Final   Escherichia coli NOT DETECTED NOT DETECTED Final   Klebsiella oxytoca NOT DETECTED NOT DETECTED Final   Klebsiella pneumoniae NOT DETECTED NOT DETECTED Final   Proteus species NOT DETECTED NOT DETECTED Final   Serratia marcescens NOT DETECTED NOT  DETECTED Final   Haemophilus influenzae NOT DETECTED NOT DETECTED Final   Neisseria meningitidis NOT DETECTED NOT DETECTED Final   Pseudomonas aeruginosa NOT DETECTED NOT DETECTED Final   Candida albicans NOT DETECTED NOT DETECTED Final   Candida glabrata NOT DETECTED NOT DETECTED Final   Candida krusei NOT DETECTED NOT DETECTED Final   Candida parapsilosis NOT DETECTED NOT DETECTED Final   Candida tropicalis NOT DETECTED NOT DETECTED Final    Comment: Performed at  [facility], [place] [address], [place], [place] [address]  Culture, blood (routine x 2)     Status: None   Collection Time: 05/12/18  6:30 PM  Result Value Ref Range Status   Specimen Description BLOOD RIGHT HAND  Final   Special Requests AEROBIC BOTTLE ONLY Blood Culture adequate volume  Final   Culture   Final    NO GROWTH 5 DAYS Performed at [facility], [place] [address] [place],  [address]    Report Status 05/17/2018 FINAL  Final    Coagulation Studies: No results for input(s): LABPROT, INR in the last 72 hours.  Urinalysis: No results for input(s): COLORURINE, LABSPEC, PHURINE, GLUCOSEU, HGBUR, BILIRUBINUR, KETONESUR, PROTEINUR, UROBILINOGEN, NITRITE, LEUKOCYTESUR in the last 72 hours.  Invalid input(s): APPERANCEUR    Imaging: Dg Abd 1 View  Result Date: 05/22/2018 CLINICAL DATA:  NG tube placement. EXAM: ABDOMEN - 1 VIEW COMPARISON:  05/13/2018 FINDINGS: Enteric tube has tip just right of midline in the mid abdomen likely over the distal stomach. Bowel gas pattern is nonobstructive. Remainder the exam is unchanged. IMPRESSION: Nonobstructive bowel gas pattern. Enteric tube with tip just right of midline in the mid abdomen likely over the distal stomach. Electronically Signed   By: Marin Olp M.D.   On: 05/22/2018 14:13   Ir Fluoro Guide Cv Line Left  Result Date: 05/23/2018 INDICATION: 85 year old with malpositioned dialysis catheter. Patient has a non tunneled triple-lumen left jugular dialysis catheter. Catheter tip is flipped cephalad into the right innominate vein region. EXAM: EXCHANGE OF NON TUNNELED DIALYSIS CATHETER WITH FLUOROSCOPY Physician: Stephan Minister. Henn, MD MEDICATIONS: None ANESTHESIA/SEDATION: None FLUOROSCOPY TIME:  Fluoroscopy Time: 1 minutes, 6 seconds, 0.4 mGy COMPLICATIONS: None immediate. PROCEDURE: Informed consent was obtained for central venous catheter exchange. The patient was placed supine on the interventional table. The left neck and  existing catheter were prepped and draped in a sterile fashion. Maximal barrier sterile technique was utilized including caps, mask, sterile gowns, sterile gloves, sterile drape, hand hygiene and skin antiseptic. Heparin was removed from the lumens. The retention suture was cut and removed. Catheter was removed over a stiff Amplatz wire. A new 20 cm Mahurkar catheter was advanced over the wire and positioned at the SVC right / atrium junction. Lumens aspirated and flushed well. Appropriate amount of heparin was placed in the dialysis lumens. Catheter was sutured to skin. Dressing was placed over the new catheter. Fluoroscopic images were taken and saved for this procedure. FINDINGS: New catheter tip at the superior cavoatrial junction. IMPRESSION: Successful exchange of the non tunneled dialysis catheter with fluoroscopy. Electronically Signed   By: Markus Daft M.D.   On: 05/23/2018 15:13   Dg Chest Port 1 View  Result Date: 05/22/2018 CLINICAL DATA:  85 year old female with shortness of breath EXAM: PORTABLE CHEST 1 VIEW COMPARISON:  05/13/2018 FINDINGS: Cardiomediastinal silhouette unchanged in size and contour. Reticular opacities throughout the lungs with persisting opacity at the left lung base obscuring the left heart border and the left  hemidiaphragm. No pneumothorax. Enteric tube traverses the mediastinum and terminates out of the field of view. Interval withdrawal of the left IJ hemodialysis catheter, now with the tip reversed into the right brachiocephalic vein. IMPRESSION: Similar pattern of reticular opacities of the lungs, potentially representing edema, chronic changes, and/or infection. Opacity at the left lung base, likely a combination of pleural fluid and associated atelectasis/consolidation. There has been interval withdrawal of the left IJ hemodialysis catheter, which has now become reversed terminating in the right brachiocephalic vein. These results were called by telephone at the time of  interpretation on 05/22/2018 at 7:16 pm to nurse caring for the patient, Mr Broadus John. Enteric feeding tube terminates out of the field of view. Electronically Signed   By: Corrie Mckusick D.O.   On: 05/22/2018 19:21     Medications:       Assessment/ Plan:  85 y.o. female with a PMHx of stage IV sacral decubitus ulcer, failure to thrive, GERD, immobility, severe protein calorie malnutrition, recent acute renal failure, MGUS, diabetes mellitus type 2, hypertension, who was admitted to [facility] on 05/05/2018 for ongoing treatment of Candida glabrata sepsis, pancolitis with gastritis, altered mental status, failure to thrive, protein calorie malnutrition, recurrent aspiration, and acute renal failure.  1.  Acute renal failure. 2.  Chronic kidney disease stage IV. 3.  Altered mental status. 4.  Failure to thrive. 5.  Severe protein calorie malnutrition. 6.  Acidosis. 7.  Anemia of chronic kidney disease.  Plan:   The patient's dialysis catheter was malpositioned and therefore she underwent catheter exchange yesterday.  She did not receive her dialysis treatment yesterday.  Therefore we will plan for dialysis treatment today and then get her back on her schedule tomorrow.  Overall however her status remains quite compromised.  No significant improvement in her status since initiation of dialysis.  Recommend another discussion with the family regarding goals of care.   LOS: 0 Nygel Prokop 5/1/20201:27 PM

## 2018-05-24 NOTE — Progress Notes (Signed)
Nephrologist has been informed that pt was not dialyze yesterday. Per HD charge nurse the HD cath was placed late afternoon. Per nephrologist to dialyze pt today and use the orders yesterday and use the lab results yesterday as well.

## 2018-05-24 DEATH — deceased

## 2018-05-25 LAB — CBC
HCT: 23.3 % — ABNORMAL LOW (ref 36.0–46.0)
Hemoglobin: 7.6 g/dL — ABNORMAL LOW (ref 12.0–15.0)
MCH: 27 pg (ref 26.0–34.0)
MCHC: 32.6 g/dL (ref 30.0–36.0)
MCV: 82.6 fL (ref 80.0–100.0)
Platelets: 241 10*3/uL (ref 150–400)
RBC: 2.82 MIL/uL — ABNORMAL LOW (ref 3.87–5.11)
RDW: 16.7 % — ABNORMAL HIGH (ref 11.5–15.5)
WBC: 18.4 10*3/uL — ABNORMAL HIGH (ref 4.0–10.5)
nRBC: 0 % (ref 0.0–0.2)

## 2018-05-25 LAB — RENAL FUNCTION PANEL
Albumin: 1.6 g/dL — ABNORMAL LOW (ref 3.5–5.0)
Anion gap: 12 (ref 5–15)
BUN: 91 mg/dL — ABNORMAL HIGH (ref 8–23)
CO2: 24 mmol/L (ref 22–32)
Calcium: 8.8 mg/dL — ABNORMAL LOW (ref 8.9–10.3)
Chloride: 98 mmol/L (ref 98–111)
Creatinine, Ser: 1.86 mg/dL — ABNORMAL HIGH (ref 0.44–1.00)
GFR calc Af Amer: 28 mL/min — ABNORMAL LOW (ref >60)
GFR calc non Af Amer: 24 mL/min — ABNORMAL LOW (ref >60)
Glucose, Bld: 134 mg/dL — ABNORMAL HIGH (ref 70–99)
Phosphorus: 1.2 mg/dL — ABNORMAL LOW (ref 2.5–4.6)
Potassium: 3.8 mmol/L (ref 3.5–5.1)
Sodium: 134 mmol/L — ABNORMAL LOW (ref 135–145)

## 2018-05-26 NOTE — Progress Notes (Signed)
[facility]  ROUNDING NOTE   Subjective:  Azotemia has improved significantly. BUN down to 91. Creatinine currently 1.8. However mental status not significantly changed.   Objective:  Vital signs in last 24 hours:  Temperature 97.6 pulse 89 respiration 30 blood pressure 106/57  Physical Exam: General: Critically ill-appearing female  Head: Normocephalic, atraumatic. Moist oral mucosal membranes  Eyes: Anicteric  Neck: Supple, trachea midline  Lungs:  Scattered rhonchi  Heart: S1S2 no rubs  Abdomen:  Soft, nontender, bowel sounds present  Extremities: 1+ peripheral edema.  Neurologic: Lethargic but arousable  Skin: No lesions       Basic Metabolic Panel: Recent Labs  Lab 05/20/18 0707 05/21/18 0532 05/23/18 0500 05/25/18 0307  NA 136 132* 132* 134*  K 4.0 3.7 3.4* 3.8  CL 101 98 98 98  CO2 24 22 21* 24  GLUCOSE 170* 191* 87 134*  BUN 134* 171* 131* 91*  CREATININE 2.51* 2.89* 2.54* 1.86*  CALCIUM 9.1 9.3 9.1 8.8*  PHOS  --  <1.0* 1.7* 1.2*    Liver Function Tests: Recent Labs  Lab 05/21/18 0532 05/23/18 0500 05/25/18 0307  ALBUMIN 1.5* 1.7* 1.6*   No results for input(s): LIPASE, AMYLASE in the last 168 hours. No results for input(s): AMMONIA in the last 168 hours.  CBC: Recent Labs  Lab 05/20/18 0707 05/21/18 0532 05/23/18 0500 05/25/18 0307  WBC 17.7* 16.6* 19.9* 18.4*  HGB 8.5* 8.5* 9.0* 7.6*  HCT 26.0* 25.7* 27.0* 23.3*  MCV 82.3 81.6 79.9* 82.6  PLT 300 328 319 241    Cardiac Enzymes: No results for input(s): CKTOTAL, CKMB, CKMBINDEX, TROPONINI in the last 168 hours.  BNP: Invalid input(s): POCBNP  CBG: No results for input(s): GLUCAP in the last 168 hours.  Microbiology: Results for orders placed or performed during the hospital encounter of 04/29/2018  Culture, blood (routine x 2)     Status: Abnormal   Collection Time: 05/12/18  6:20 PM  Result Value Ref Range Status   Specimen Description BLOOD LEFT HAND  Final    Special Requests AEROBIC BOTTLE ONLY Blood Culture adequate volume  Final   Culture  Setup Time   Final    GRAM POSITIVE RODS AEROBIC BOTTLE ONLY CRITICAL RESULT CALLED TO, READ BACK BY AND VERIFIED WITH: Megan Salon RN 12:05 05/14/18 ([email or web address])    Culture (A)  Final    LACTOBACILLUS SPECIES Standardized susceptibility testing for this organism is not available.    Report Status 05/15/2018 FINAL  Final  Blood Culture ID Panel (Reflexed)     Status: None   Collection Time: 05/12/18  6:20 PM  Result Value Ref Range Status   Enterococcus species NOT DETECTED NOT DETECTED Final   Listeria monocytogenes NOT DETECTED NOT DETECTED Final   Staphylococcus species NOT DETECTED NOT DETECTED Final   Staphylococcus aureus (BCID) NOT DETECTED NOT DETECTED Final   Streptococcus species NOT DETECTED NOT DETECTED Final   Streptococcus agalactiae NOT DETECTED NOT DETECTED Final   Streptococcus pneumoniae NOT DETECTED NOT DETECTED Final   Streptococcus pyogenes NOT DETECTED NOT DETECTED Final   Acinetobacter baumannii NOT DETECTED NOT DETECTED Final   Enterobacteriaceae species NOT DETECTED NOT DETECTED Final   Enterobacter cloacae complex NOT DETECTED NOT DETECTED Final   Escherichia coli NOT DETECTED NOT DETECTED Final   Klebsiella oxytoca NOT DETECTED NOT DETECTED Final   Klebsiella pneumoniae NOT DETECTED NOT DETECTED Final   Proteus species NOT DETECTED NOT DETECTED Final   Serratia marcescens NOT DETECTED NOT DETECTED Final  Haemophilus influenzae NOT DETECTED NOT DETECTED Final   Neisseria meningitidis NOT DETECTED NOT DETECTED Final   Pseudomonas aeruginosa NOT DETECTED NOT DETECTED Final   Candida albicans NOT DETECTED NOT DETECTED Final   Candida glabrata NOT DETECTED NOT DETECTED Final   Candida krusei NOT DETECTED NOT DETECTED Final   Candida parapsilosis NOT DETECTED NOT DETECTED Final   Candida tropicalis NOT DETECTED NOT DETECTED Final    Comment: Performed at [facility], [place] [address] [place], [place] [address]  Culture, blood (routine x 2)     Status: None   Collection Time: 05/12/18  6:30 PM  Result Value Ref Range Status   Specimen Description BLOOD RIGHT HAND  Final   Special Requests AEROBIC BOTTLE ONLY Blood Culture adequate volume  Final   Culture   Final    NO GROWTH 5 DAYS Performed at [facility], [place] [address], [place], [place] [address]    Report Status 05/17/2018 FINAL  Final    Coagulation Studies: No results for input(s): LABPROT, INR in the last 72 hours.  Urinalysis: No results for input(s): COLORURINE, LABSPEC, PHURINE, GLUCOSEU, HGBUR, BILIRUBINUR, KETONESUR, PROTEINUR, UROBILINOGEN, NITRITE, LEUKOCYTESUR in the last 72 hours.  Invalid input(s): APPERANCEUR    Imaging: No results found.   Medications:       Assessment/ Plan:  85 y.o. female with a PMHx of stage IV sacral decubitus ulcer, failure to thrive, GERD, immobility, severe protein calorie malnutrition, recent acute renal failure, MGUS, diabetes mellitus type 2, hypertension, who was admitted to [facility] on 05/03/2018 for ongoing treatment of Candida glabrata sepsis, pancolitis with gastritis, altered mental status, failure to thrive, protein calorie malnutrition, recurrent aspiration, and acute renal failure.  1.  Acute renal failure. 2.  Chronic kidney disease stage IV. 3.  Altered mental status. 4.  Failure to thrive. 5.  Severe protein calorie malnutrition. 6.  Acidosis. 7.  Anemia of chronic kidney disease.  Plan:   As before the patient's mental status has not significantly changed.  BUN down to 91 with a creatinine of 1.86.  We will plan for dialysis again on Tuesday.  Hospitalist again to discuss goals of care with the patient's family.  Hemoglobin is down to 7.6.  Consider blood transfusion for hemoglobin of 7 or less.  Patient has severe protein calorie malnutrition of phosphorus is low at 1.2 and albumin is also quite low at 1.6.   LOS:  0 Krishon Adkison 5/3/20202:26 PM

## 2018-05-28 LAB — CBC
HCT: 20.8 % — ABNORMAL LOW (ref 36.0–46.0)
Hemoglobin: 6.9 g/dL — CL (ref 12.0–15.0)
MCH: 27.1 pg (ref 26.0–34.0)
MCHC: 33.2 g/dL (ref 30.0–36.0)
MCV: 81.6 fL (ref 80.0–100.0)
Platelets: 210 10*3/uL (ref 150–400)
RBC: 2.55 MIL/uL — ABNORMAL LOW (ref 3.87–5.11)
RDW: 16.6 % — ABNORMAL HIGH (ref 11.5–15.5)
WBC: 15.9 10*3/uL — ABNORMAL HIGH (ref 4.0–10.5)
nRBC: 0 % (ref 0.0–0.2)

## 2018-05-28 LAB — RENAL FUNCTION PANEL
Albumin: 1.3 g/dL — ABNORMAL LOW (ref 3.5–5.0)
Anion gap: 13 (ref 5–15)
BUN: 183 mg/dL — ABNORMAL HIGH (ref 8–23)
CO2: 23 mmol/L (ref 22–32)
Calcium: 9.5 mg/dL (ref 8.9–10.3)
Chloride: 91 mmol/L — ABNORMAL LOW (ref 98–111)
Creatinine, Ser: 2.38 mg/dL — ABNORMAL HIGH (ref 0.44–1.00)
GFR calc Af Amer: 21 mL/min — ABNORMAL LOW (ref >60)
GFR calc non Af Amer: 18 mL/min — ABNORMAL LOW (ref >60)
Glucose, Bld: 156 mg/dL — ABNORMAL HIGH (ref 70–99)
Phosphorus: 1.7 mg/dL — ABNORMAL LOW (ref 2.5–4.6)
Potassium: 3.4 mmol/L — ABNORMAL LOW (ref 3.5–5.1)
Sodium: 127 mmol/L — ABNORMAL LOW (ref 135–145)

## 2018-05-29 NOTE — Progress Notes (Signed)
[facility]  ROUNDING NOTE   Subjective:  Patient seen at bedside. Continues to have significant azotemia with a BUN of 183 and creatinine of 2.38. She did undergo hemodialysis yesterday. Next dialysis treatment tomorrow..   Objective:  Vital signs in last 24 hours:  Temperature 96.7 pulse 102 respirations 34 blood pressure 91/46  Physical Exam: General: Critically ill-appearing female  Head: Normocephalic, atraumatic. Moist oral mucosal membranes  Eyes: Anicteric  Neck: Supple, trachea midline  Lungs:  Scattered rhonchi  Heart: S1S2 no rubs  Abdomen:  Soft, nontender, bowel sounds present  Extremities: 1+ peripheral edema.  Neurologic: Lethargic, not following commands  Skin: No lesions       Basic Metabolic Panel: Recent Labs  Lab 05/23/18 0500 05/25/18 0307 05/28/18 1233  NA 132* 134* 127*  K 3.4* 3.8 3.4*  CL 98 98 91*  CO2 21* 24 23  GLUCOSE 87 134* 156*  BUN 131* 91* 183*  CREATININE 2.54* 1.86* 2.38*  CALCIUM 9.1 8.8* 9.5  PHOS 1.7* 1.2* 1.7*    Liver Function Tests: Recent Labs  Lab 05/23/18 0500 05/25/18 0307 05/28/18 1233  ALBUMIN 1.7* 1.6* 1.3*   No results for input(s): LIPASE, AMYLASE in the last 168 hours. No results for input(s): AMMONIA in the last 168 hours.  CBC: Recent Labs  Lab 05/23/18 0500 05/25/18 0307 05/28/18 1233  WBC 19.9* 18.4* 15.9*  HGB 9.0* 7.6* 6.9*  HCT 27.0* 23.3* 20.8*  MCV 79.9* 82.6 81.6  PLT 319 241 210    Cardiac Enzymes: No results for input(s): CKTOTAL, CKMB, CKMBINDEX, TROPONINI in the last 168 hours.  BNP: Invalid input(s): POCBNP  CBG: No results for input(s): GLUCAP in the last 168 hours.  Microbiology: Results for orders placed or performed during the hospital encounter of 04/27/2018  Culture, blood (routine x 2)     Status: Abnormal   Collection Time: 05/12/18  6:20 PM  Result Value Ref Range Status   Specimen Description BLOOD LEFT HAND  Final   Special Requests AEROBIC  BOTTLE ONLY Blood Culture adequate volume  Final   Culture  Setup Time   Final    GRAM POSITIVE RODS AEROBIC BOTTLE ONLY CRITICAL RESULT CALLED TO, READ BACK BY AND VERIFIED WITH: Megan Salon RN 12:05 05/14/18 ([email or web address])    Culture (A)  Final    LACTOBACILLUS SPECIES Standardized susceptibility testing for this organism is not available.    Report Status 05/15/2018 FINAL  Final  Blood Culture ID Panel (Reflexed)     Status: None   Collection Time: 05/12/18  6:20 PM  Result Value Ref Range Status   Enterococcus species NOT DETECTED NOT DETECTED Final   Listeria monocytogenes NOT DETECTED NOT DETECTED Final   Staphylococcus species NOT DETECTED NOT DETECTED Final   Staphylococcus aureus (BCID) NOT DETECTED NOT DETECTED Final   Streptococcus species NOT DETECTED NOT DETECTED Final   Streptococcus agalactiae NOT DETECTED NOT DETECTED Final   Streptococcus pneumoniae NOT DETECTED NOT DETECTED Final   Streptococcus pyogenes NOT DETECTED NOT DETECTED Final   Acinetobacter baumannii NOT DETECTED NOT DETECTED Final   Enterobacteriaceae species NOT DETECTED NOT DETECTED Final   Enterobacter cloacae complex NOT DETECTED NOT DETECTED Final   Escherichia coli NOT DETECTED NOT DETECTED Final   Klebsiella oxytoca NOT DETECTED NOT DETECTED Final   Klebsiella pneumoniae NOT DETECTED NOT DETECTED Final   Proteus species NOT DETECTED NOT DETECTED Final   Serratia marcescens NOT DETECTED NOT DETECTED Final   Haemophilus influenzae NOT DETECTED NOT DETECTED Final  Neisseria meningitidis NOT DETECTED NOT DETECTED Final   Pseudomonas aeruginosa NOT DETECTED NOT DETECTED Final   Candida albicans NOT DETECTED NOT DETECTED Final   Candida glabrata NOT DETECTED NOT DETECTED Final   Candida krusei NOT DETECTED NOT DETECTED Final   Candida parapsilosis NOT DETECTED NOT DETECTED Final   Candida tropicalis NOT DETECTED NOT DETECTED Final    Comment: Performed at [facility], [place] [address]  [place], [place] [address]  Culture, blood (routine x 2)     Status: None   Collection Time: 05/12/18  6:30 PM  Result Value Ref Range Status   Specimen Description BLOOD RIGHT HAND  Final   Special Requests AEROBIC BOTTLE ONLY Blood Culture adequate volume  Final   Culture   Final    NO GROWTH 5 DAYS Performed at [facility], [place] [address], [place], [place] [address]    Report Status 05/17/2018 FINAL  Final    Coagulation Studies: No results for input(s): LABPROT, INR in the last 72 hours.  Urinalysis: No results for input(s): COLORURINE, LABSPEC, PHURINE, GLUCOSEU, HGBUR, BILIRUBINUR, KETONESUR, PROTEINUR, UROBILINOGEN, NITRITE, LEUKOCYTESUR in the last 72 hours.  Invalid input(s): APPERANCEUR    Imaging: No results found.   Medications:       Assessment/ Plan:  85 y.o. female with a PMHx of stage IV sacral decubitus ulcer, failure to thrive, GERD, immobility, severe protein calorie malnutrition, recent acute renal failure, MGUS, diabetes mellitus type 2, hypertension, who was admitted to [facility] on 05/12/2018 for ongoing treatment of Candida glabrata sepsis, pancolitis with gastritis, altered mental status, failure to thrive, protein calorie malnutrition, recurrent aspiration, and acute renal failure.  1.  Acute renal failure. 2.  Chronic kidney disease stage IV. 3.  Altered mental status. 4.  Failure to thrive. 5.  Severe protein calorie malnutrition. 6.  Acidosis. 7.  Anemia of chronic kidney disease.  Plan:   Patient still has significant azotemia.  Last BUN was 183 with a creatinine of 3.38.  Thereafter she did undergo hemodialysis.  She continues to be severely malnourished with a phosphorus of 1.7 and albumin of 1.3.  We will plan for hemodialysis again tomorrow however unclear as to what benefit it is leading to as she has not improved at all since initiation of dialysis treatment.  Family meeting to occur tomorrow.  Patient also has significant anemia.   Consider blood transfusion for hemoglobin of 6.9 but defer to hospitalist.   LOS: 0 Ada Holness 5/6/202011:06 AM

## 2018-05-30 LAB — RENAL FUNCTION PANEL
Albumin: 1.4 g/dL — ABNORMAL LOW (ref 3.5–5.0)
Anion gap: 13 (ref 5–15)
BUN: 127 mg/dL — ABNORMAL HIGH (ref 8–23)
CO2: 23 mmol/L (ref 22–32)
Calcium: 9.5 mg/dL (ref 8.9–10.3)
Chloride: 94 mmol/L — ABNORMAL LOW (ref 98–111)
Creatinine, Ser: 1.86 mg/dL — ABNORMAL HIGH (ref 0.44–1.00)
GFR calc Af Amer: 28 mL/min — ABNORMAL LOW (ref >60)
GFR calc non Af Amer: 24 mL/min — ABNORMAL LOW (ref >60)
Glucose, Bld: 139 mg/dL — ABNORMAL HIGH (ref 70–99)
Phosphorus: 1.5 mg/dL — ABNORMAL LOW (ref 2.5–4.6)
Potassium: 3.5 mmol/L (ref 3.5–5.1)
Sodium: 130 mmol/L — ABNORMAL LOW (ref 135–145)

## 2018-05-30 LAB — CBC
HCT: 20.4 % — ABNORMAL LOW (ref 36.0–46.0)
Hemoglobin: 6.8 g/dL — CL (ref 12.0–15.0)
MCH: 27.1 pg (ref 26.0–34.0)
MCHC: 33.3 g/dL (ref 30.0–36.0)
MCV: 81.3 fL (ref 80.0–100.0)
Platelets: 227 10*3/uL (ref 150–400)
RBC: 2.51 MIL/uL — ABNORMAL LOW (ref 3.87–5.11)
RDW: 16.4 % — ABNORMAL HIGH (ref 11.5–15.5)
WBC: 10.8 10*3/uL — ABNORMAL HIGH (ref 4.0–10.5)
nRBC: 0.2 % (ref 0.0–0.2)

## 2018-05-30 LAB — PREPARE RBC (CROSSMATCH)

## 2018-05-31 LAB — BLOOD GAS, ARTERIAL
Acid-Base Excess: 3.3 mmol/L — ABNORMAL HIGH (ref 0.0–2.0)
Allens test (pass/fail): POSITIVE — AB
Bicarbonate: 26 mmol/L (ref 20.0–28.0)
Delivery systems: POSITIVE
Expiratory PAP: 10
FIO2: 24
Inspiratory PAP: 16
O2 Saturation: 97.3 %
Patient temperature: 98.6
RATE: 39 resp/min
pCO2 arterial: 30.7 mmHg — ABNORMAL LOW (ref 32.0–48.0)
pH, Arterial: 7.538 — ABNORMAL HIGH (ref 7.350–7.450)
pO2, Arterial: 77.6 mmHg — ABNORMAL LOW (ref 83.0–108.0)

## 2018-05-31 LAB — RENAL FUNCTION PANEL
Albumin: 1.4 g/dL — ABNORMAL LOW (ref 3.5–5.0)
Anion gap: 12 (ref 5–15)
BUN: 157 mg/dL — ABNORMAL HIGH (ref 8–23)
CO2: 24 mmol/L (ref 22–32)
Calcium: 9.4 mg/dL (ref 8.9–10.3)
Chloride: 95 mmol/L — ABNORMAL LOW (ref 98–111)
Creatinine, Ser: 1.93 mg/dL — ABNORMAL HIGH (ref 0.44–1.00)
GFR calc Af Amer: 27 mL/min — ABNORMAL LOW (ref >60)
GFR calc non Af Amer: 23 mL/min — ABNORMAL LOW (ref >60)
Glucose, Bld: 101 mg/dL — ABNORMAL HIGH (ref 70–99)
Phosphorus: 1.5 mg/dL — ABNORMAL LOW (ref 2.5–4.6)
Potassium: 3.5 mmol/L (ref 3.5–5.1)
Sodium: 131 mmol/L — ABNORMAL LOW (ref 135–145)

## 2018-05-31 LAB — CBC WITH DIFFERENTIAL/PLATELET
Abs Immature Granulocytes: 0.13 10*3/uL — ABNORMAL HIGH (ref 0.00–0.07)
Basophils Absolute: 0 10*3/uL (ref 0.0–0.1)
Basophils Relative: 0 %
Eosinophils Absolute: 0 10*3/uL (ref 0.0–0.5)
Eosinophils Relative: 0 %
HCT: 23.4 % — ABNORMAL LOW (ref 36.0–46.0)
Hemoglobin: 8 g/dL — ABNORMAL LOW (ref 12.0–15.0)
Immature Granulocytes: 1 %
Lymphocytes Relative: 16 %
Lymphs Abs: 1.8 10*3/uL (ref 0.7–4.0)
MCH: 28.2 pg (ref 26.0–34.0)
MCHC: 34.2 g/dL (ref 30.0–36.0)
MCV: 82.4 fL (ref 80.0–100.0)
Monocytes Absolute: 0.4 10*3/uL (ref 0.1–1.0)
Monocytes Relative: 4 %
Neutro Abs: 9.4 10*3/uL — ABNORMAL HIGH (ref 1.7–7.7)
Neutrophils Relative %: 79 %
Platelets: 177 10*3/uL (ref 150–400)
RBC: 2.84 MIL/uL — ABNORMAL LOW (ref 3.87–5.11)
RDW: 15.7 % — ABNORMAL HIGH (ref 11.5–15.5)
WBC: 11.8 10*3/uL — ABNORMAL HIGH (ref 4.0–10.5)
nRBC: 0 % (ref 0.0–0.2)

## 2018-05-31 LAB — TYPE AND SCREEN
ABO/RH(D): A POS
Antibody Screen: NEGATIVE
Unit division: 0

## 2018-05-31 LAB — BPAM RBC
Blood Product Expiration Date: 202005142359
ISSUE DATE / TIME: 202005071355
Unit Type and Rh: 6200

## 2018-05-31 NOTE — Progress Notes (Signed)
[facility]  ROUNDING NOTE   Subjective:  No significant improvement in status. Underwent dialysis yesterday. Azotemia improved with dialysis. Albumin still very low at 1.4   Objective:  Vital signs in last 24 hours:  Temperature 98.8 pulse 112 respirations 24 blood pressure 154/68  Physical Exam: General: Critically ill-appearing female  Head: Normocephalic, atraumatic. Moist oral mucosal membranes  Eyes: Anicteric  Neck: Supple, trachea midline  Lungs:  Scattered rhonchi  Heart: S1S2 no rubs  Abdomen:  Soft, nontender, bowel sounds present  Extremities: 1+ peripheral edema.  Neurologic: Lethargic, not following commands  Skin: No lesions       Basic Metabolic Panel: Recent Labs  Lab 05/25/18 0307 05/28/18 1233 05/30/18 0725  NA 134* 127* 130*  K 3.8 3.4* 3.5  CL 98 91* 94*  CO2 24 23 23   GLUCOSE 134* 156* 139*  BUN 91* 183* 127*  CREATININE 1.86* 2.38* 1.86*  CALCIUM 8.8* 9.5 9.5  PHOS 1.2* 1.7* 1.5*    Liver Function Tests: Recent Labs  Lab 05/25/18 0307 05/28/18 1233 05/30/18 0725  ALBUMIN 1.6* 1.3* 1.4*   No results for input(s): LIPASE, AMYLASE in the last 168 hours. No results for input(s): AMMONIA in the last 168 hours.  CBC: Recent Labs  Lab 05/25/18 0307 05/28/18 1233 05/30/18 0725 05/31/18 0320  WBC 18.4* 15.9* 10.8* 11.8*  NEUTROABS  --   --   --  9.4*  HGB 7.6* 6.9* 6.8* 8.0*  HCT 23.3* 20.8* 20.4* 23.4*  MCV 82.6 81.6 81.3 82.4  PLT 241 210 227 177    Cardiac Enzymes: No results for input(s): CKTOTAL, CKMB, CKMBINDEX, TROPONINI in the last 168 hours.  BNP: Invalid input(s): POCBNP  CBG: No results for input(s): GLUCAP in the last 168 hours.  Microbiology: Results for orders placed or performed during the hospital encounter of 05/08/2018  Culture, blood (routine x 2)     Status: Abnormal   Collection Time: 05/12/18  6:20 PM  Result Value Ref Range Status   Specimen Description BLOOD LEFT HAND  Final   Special Requests AEROBIC BOTTLE ONLY Blood Culture adequate volume  Final   Culture  Setup Time   Final    GRAM POSITIVE RODS AEROBIC BOTTLE ONLY CRITICAL RESULT CALLED TO, READ BACK BY AND VERIFIED WITH: Megan Salon RN 12:05 05/14/18 ([email or web address])    Culture (A)  Final    LACTOBACILLUS SPECIES Standardized susceptibility testing for this organism is not available.    Report Status 05/15/2018 FINAL  Final  Blood Culture ID Panel (Reflexed)     Status: None   Collection Time: 05/12/18  6:20 PM  Result Value Ref Range Status   Enterococcus species NOT DETECTED NOT DETECTED Final   Listeria monocytogenes NOT DETECTED NOT DETECTED Final   Staphylococcus species NOT DETECTED NOT DETECTED Final   Staphylococcus aureus (BCID) NOT DETECTED NOT DETECTED Final   Streptococcus species NOT DETECTED NOT DETECTED Final   Streptococcus agalactiae NOT DETECTED NOT DETECTED Final   Streptococcus pneumoniae NOT DETECTED NOT DETECTED Final   Streptococcus pyogenes NOT DETECTED NOT DETECTED Final   Acinetobacter baumannii NOT DETECTED NOT DETECTED Final   Enterobacteriaceae species NOT DETECTED NOT DETECTED Final   Enterobacter cloacae complex NOT DETECTED NOT DETECTED Final   Escherichia coli NOT DETECTED NOT DETECTED Final   Klebsiella oxytoca NOT DETECTED NOT DETECTED Final   Klebsiella pneumoniae NOT DETECTED NOT DETECTED Final   Proteus species NOT DETECTED NOT DETECTED Final   Serratia marcescens NOT DETECTED NOT DETECTED Final  Haemophilus influenzae NOT DETECTED NOT DETECTED Final   Neisseria meningitidis NOT DETECTED NOT DETECTED Final   Pseudomonas aeruginosa NOT DETECTED NOT DETECTED Final   Candida albicans NOT DETECTED NOT DETECTED Final   Candida glabrata NOT DETECTED NOT DETECTED Final   Candida krusei NOT DETECTED NOT DETECTED Final   Candida parapsilosis NOT DETECTED NOT DETECTED Final   Candida tropicalis NOT DETECTED NOT DETECTED Final    Comment: Performed at [facility],  [place] [address], [place], [place] [address]  Culture, blood (routine x 2)     Status: None   Collection Time: 05/12/18  6:30 PM  Result Value Ref Range Status   Specimen Description BLOOD RIGHT HAND  Final   Special Requests AEROBIC BOTTLE ONLY Blood Culture adequate volume  Final   Culture   Final    NO GROWTH 5 DAYS Performed at [facility], [place] [address] [place], [place] [address]    Report Status 05/17/2018 FINAL  Final    Coagulation Studies: No results for input(s): LABPROT, INR in the last 72 hours.  Urinalysis: No results for input(s): COLORURINE, LABSPEC, PHURINE, GLUCOSEU, HGBUR, BILIRUBINUR, KETONESUR, PROTEINUR, UROBILINOGEN, NITRITE, LEUKOCYTESUR in the last 72 hours.  Invalid input(s): APPERANCEUR    Imaging: No results found.   Medications:       Assessment/ Plan:  85 y.o. female with a PMHx of stage IV sacral decubitus ulcer, failure to thrive, GERD, immobility, severe protein calorie malnutrition, recent acute renal failure, MGUS, diabetes mellitus type 2, hypertension, who was admitted to [facility] on 05/17/2018 for ongoing treatment of Candida glabrata sepsis, pancolitis with gastritis, altered mental status, failure to thrive, protein calorie malnutrition, recurrent aspiration, and acute renal failure.  1.  Acute renal failure. 2.  Chronic kidney disease stage IV. 3.  Altered mental status. 4.  Failure to thrive. 5.  Severe protein calorie malnutrition. 6.  Acidosis. 7.  Anemia of chronic kidney disease.  Plan:   Patient remains critically ill at this time.  No significant improvement in status.  She underwent dialysis yesterday.  There has been some improvement in her azotemia.  We will plan for dialysis again tomorrow.  Hemoglobin improved posttransfusion.  Overall prognosis remains guarded and poor.   LOS: 0 Chelcie Estorga 5/8/20208:53 AM

## 2018-06-01 ENCOUNTER — Other Ambulatory Visit (HOSPITAL_COMMUNITY): Payer: Medicare Other

## 2018-06-01 ENCOUNTER — Encounter: Payer: Self-pay | Admitting: Internal Medicine

## 2018-06-01 DIAGNOSIS — J690 Pneumonitis due to inhalation of food and vomit: Secondary | ICD-10-CM | POA: Diagnosis not present

## 2018-06-01 DIAGNOSIS — I4729 Other ventricular tachycardia: Secondary | ICD-10-CM

## 2018-06-01 DIAGNOSIS — N179 Acute kidney failure, unspecified: Secondary | ICD-10-CM | POA: Diagnosis not present

## 2018-06-01 DIAGNOSIS — I472 Ventricular tachycardia: Secondary | ICD-10-CM | POA: Diagnosis not present

## 2018-06-01 DIAGNOSIS — J9621 Acute and chronic respiratory failure with hypoxia: Secondary | ICD-10-CM | POA: Diagnosis not present

## 2018-06-01 DIAGNOSIS — N189 Chronic kidney disease, unspecified: Secondary | ICD-10-CM

## 2018-06-01 LAB — RENAL FUNCTION PANEL
Albumin: 1.5 g/dL — ABNORMAL LOW (ref 3.5–5.0)
Anion gap: 11 (ref 5–15)
BUN: 82 mg/dL — ABNORMAL HIGH (ref 8–23)
CO2: 24 mmol/L (ref 22–32)
Calcium: 8.7 mg/dL — ABNORMAL LOW (ref 8.9–10.3)
Chloride: 99 mmol/L (ref 98–111)
Creatinine, Ser: 1.37 mg/dL — ABNORMAL HIGH (ref 0.44–1.00)
GFR calc Af Amer: 40 mL/min — ABNORMAL LOW (ref >60)
GFR calc non Af Amer: 35 mL/min — ABNORMAL LOW (ref >60)
Glucose, Bld: 39 mg/dL — CL (ref 70–99)
Phosphorus: <1 mg/dL — CL (ref 2.5–4.6)
Potassium: 3.2 mmol/L — ABNORMAL LOW (ref 3.5–5.1)
Sodium: 134 mmol/L — ABNORMAL LOW (ref 135–145)

## 2018-06-01 NOTE — Consult Note (Signed)
[facility]  PULMONARY SERVICE  Date of Service: 06/01/2018  PULMONARY CRITICAL CARE CONSULT   Lori Mack  MVH:[id number]  DOB: 1929-02-26   DOA: 04/29/2018  Referring Physician: Merton Border, MD  HPI: Lori Mack is a 85 y.o. female seen for follow up of Acute on Chronic Respiratory Failure.  Patient has multiple medical problems was transferred to our facility approximately a month ago had a history of diabetic ketoacidosis hyponatremia lactic acidosis acute renal failure hypertension respiratory failure on BiPAP metabolic encephalopathy.  Patient presented to the hospital with the above problems was noted to have increased confusion altered mental status.  Patient's glucose at the time of admission was noted to be greater than 500.  Also noted to be significantly hypoxic at the time.  Work-up revealed sepsis secondary to Candida glabrata.  Patient also had pancolitis with gastritis esophagitis and DKA.  She was eventually stabilized had some clinical improvement transfer to our facility on April 1.  She has since been on and off of BiPAP requiring oxygen.  Chest x-ray today still showing some possibility of infection versus fluid overload.  She does have increased secretions noted.  She also has been seen by nephrology for renal failure.  The most recent creatinine was 1.93 and BUN was 157  Review of Systems:  ROS performed and is unremarkable other than noted above.  Past Medical History:  Diagnosis Date  ?Marland Kitchen Anemia  ?. Diabetes mellitus (*)  ?Marland Kitchen Hypertension  ?. Left-sided weakness  ?Marland Kitchen Neuralgia, post-herpetic  ?. Stroke (*)  ?. UTI (urinary tract infection)  ?Marland Kitchen Vertigo   Past Surgical History:  Procedure Laterality Date  ?Marland Kitchen Appendectomy  ?Marland Kitchen Hernia repair  x2 both in abd   Family History  Problem Relation Age of Onset  ?Marland Kitchen Hypertension Mother  ?. Stroke Mother  ?Marland Kitchen Anesthesia problems Neg Hx  ?. Cancer Neg Hx  ?. Clotting  disorder Neg Hx  ?. Diabetes Neg Hx   Social History: Social History   Socioeconomic History  ?. Marital status: Widowed  Spouse name: Not on file  ?. Number of children: Not on file  ?. Years of education: Not on file  ?Marland Kitchen Highest education level: Not on file  Occupational History  ?Marland Kitchen Not on file  Social Needs  ?Marland Kitchen Financial resource strain: Not on file  ?Marland Kitchen Food insecurity  Worry: Not on file  Inability: Not on file  ?Marland Kitchen Transportation needs  Medical: Not on file  Non-medical: Not on file  Tobacco Use  ?Marland Kitchen Smoking status: Never Smoker  ?. Smokeless tobacco: Never Used  Substance and Sexual Activity  ?Marland Kitchen Alcohol use: No    Medications: Reviewed on Rounds  Physical Exam:  Vitals: Temperature 99.0 pulse 106 respiratory 22 blood pressure 104/52 saturations 100%  Ventilator Settings patient is on nasal cannula currently off of BiPAP  . General: Comfortable at this time . Eyes: Grossly normal lids, irises & conjunctiva . ENT: grossly tongue is normal . Neck: no obvious mass . Cardiovascular: S1-S2 normal no gallop or rub is noted . Respiratory: Coarse rhonchi noted bilaterally . Abdomen: Soft and nontender . Skin: no rash seen on limited exam . Musculoskeletal: not rigid . Psychiatric:unable to assess . Neurologic: no seizure no involuntary movements         Labs on Admission:  Basic Metabolic Panel: Recent Labs  Lab 05/28/18 1233 05/30/18 0725 05/31/18 1500 06/01/18 0325  NA 127* 130* 131* 134*  K 3.4* 3.5 3.5  3.2*  CL 91* 94* 95* 99  CO2 23 23 24 24   GLUCOSE 156* 139* 101* 39*  BUN 183* 127* 157* 82*  CREATININE 2.38* 1.86* 1.93* 1.37*  CALCIUM 9.5 9.5 9.4 8.7*  PHOS 1.7* 1.5* 1.5* <1.0*    Recent Labs  Lab 05/31/18 2215  PHART 7.538*  PCO2ART 30.7*  PO2ART 77.6*  HCO3 26.0  O2SAT 97.3    Liver Function Tests: Recent Labs  Lab 05/28/18 1233 05/30/18 0725 05/31/18 1500 06/01/18 0325  ALBUMIN 1.3* 1.4* 1.4* 1.5*   No results for input(s):  LIPASE, AMYLASE in the last 168 hours. No results for input(s): AMMONIA in the last 168 hours.  CBC: Recent Labs  Lab 05/28/18 1233 05/30/18 0725 05/31/18 0320  WBC 15.9* 10.8* 11.8*  NEUTROABS  --   --  9.4*  HGB 6.9* 6.8* 8.0*  HCT 20.8* 20.4* 23.4*  MCV 81.6 81.3 82.4  PLT 210 227 177    Cardiac Enzymes: No results for input(s): CKTOTAL, CKMB, CKMBINDEX, TROPONINI in the last 168 hours.  BNP (last 3 results) No results for input(s): BNP in the last 8760 hours.  ProBNP (last 3 results) No results for input(s): PROBNP in the last 8760 hours.   Radiological Exams on Admission: Dg Chest Port 1 View  Result Date: 06/01/2018 CLINICAL DATA:  Respiratory distress EXAM: PORTABLE CHEST 1 VIEW COMPARISON:  05/22/2018 and prior chest radiographs FINDINGS: Cardiomediastinal silhouette is unchanged. LEFT LOWER lung consolidation/atelectasis again noted. Interstitial opacities bilaterally are unchanged. There may be trace bilateral pleural effusions present. A LEFT IJ central venous catheter with tip overlying the SUPERIOR cavoatrial junction and small bore feeding tube entering the stomach with tip off the field of view again noted. There is no evidence of pneumothorax. IMPRESSION: Little significant change from the prior study with continued LEFT LOWER lung consolidation/atelectasis and bilateral interstitial opacities. Electronically Signed   By: Margarette Canada M.D.   On: 06/01/2018 14:07    Assessment/Plan Active Problems:   Acute on chronic respiratory failure with hypoxia (HCC)   Nonsustained ventricular tachycardia (HCC)   Acute on chronic renal failure (HCC)   Aspiration pneumonia due to gastric secretions ([place])   1. Acute on chronic respiratory failure with hypoxia she is continues to require oxygen the last ABG that was done today was reviewed her PO2 actually looks pretty good chest x-ray however still revealing lobar consolidation versus interstitial opacities.  I would recommend  getting a CT scan of the chest for further assessment to to rule out interstitial disease.  Patient is also found to have aspiration pneumonia 2. Nonsustained ventricular tachycardia diagnosed at the other facility right now rhythm is been stable 3. Acute on chronic renal failure followed by nephrology will monitor recommendations. 4. Aspiration pneumonia would continue with aspiration precautions.  Chest x-ray certainly could be consistent with aspiration pneumonia involving the upper lobes.  As mentioned above a CT scan of the chest to evaluate  I have personally seen and evaluated the patient, evaluated laboratory and imaging results, formulated the assessment and plan and placed orders. The Patient requires high complexity decision making for assessment and support.  Case was discussed on Rounds with the Respiratory Therapy Staff Time Spent 69minutes  Allyne Gee, MD [facility] Pulmonary Critical Care Medicine Sleep Medicine

## 2018-06-02 LAB — POTASSIUM: Potassium: 3.5 mmol/L (ref 3.5–5.1)

## 2018-06-02 LAB — PHOSPHORUS: Phosphorus: 3.3 mg/dL (ref 2.5–4.6)

## 2018-06-03 ENCOUNTER — Other Ambulatory Visit (HOSPITAL_COMMUNITY): Payer: Medicare Other

## 2018-06-03 LAB — CBC
HCT: 24.1 % — ABNORMAL LOW (ref 36.0–46.0)
HCT: 19.9 % — ABNORMAL LOW (ref 36.0–46.0)
Hemoglobin: 8.2 g/dL — ABNORMAL LOW (ref 12.0–15.0)
Hemoglobin: 6.5 g/dL — CL (ref 12.0–15.0)
MCH: 28.3 pg (ref 26.0–34.0)
MCH: 28.3 pg (ref 26.0–34.0)
MCHC: 34 g/dL (ref 30.0–36.0)
MCHC: 32.7 g/dL (ref 30.0–36.0)
MCV: 83.1 fL (ref 80.0–100.0)
MCV: 86.5 fL (ref 80.0–100.0)
Platelets: 139 10*3/uL — ABNORMAL LOW (ref 150–400)
Platelets: 124 10*3/uL — ABNORMAL LOW (ref 150–400)
RBC: 2.9 MIL/uL — ABNORMAL LOW (ref 3.87–5.11)
RBC: 2.3 MIL/uL — ABNORMAL LOW (ref 3.87–5.11)
RDW: 17.1 % — ABNORMAL HIGH (ref 11.5–15.5)
RDW: 17.1 % — ABNORMAL HIGH (ref 11.5–15.5)
WBC: 11.2 10*3/uL — ABNORMAL HIGH (ref 4.0–10.5)
WBC: 21.1 10*3/uL — ABNORMAL HIGH (ref 4.0–10.5)
nRBC: 0 % (ref 0.0–0.2)
nRBC: 0.1 % (ref 0.0–0.2)

## 2018-06-03 LAB — BLOOD GAS, ARTERIAL
Acid-Base Excess: 0.9 mmol/L (ref 0.0–2.0)
Bicarbonate: 23.6 mmol/L (ref 20.0–28.0)
Drawn by: 55062
FIO2: 40
MECHVT: 420 mL
O2 Saturation: 92.4 %
PEEP: 5 cmH2O
Patient temperature: 98.6
RATE: 15 resp/min
pCO2 arterial: 29.4 mmHg — ABNORMAL LOW (ref 32.0–48.0)
pH, Arterial: 7.516 — ABNORMAL HIGH (ref 7.350–7.450)
pO2, Arterial: 58.6 mmHg — ABNORMAL LOW (ref 83.0–108.0)

## 2018-06-03 LAB — LACTIC ACID, PLASMA: Lactic Acid, Venous: >11 mmol/L (ref 0.5–1.9)

## 2018-06-03 LAB — BASIC METABOLIC PANEL
Anion gap: 23 — ABNORMAL HIGH (ref 5–15)
BUN: 40 mg/dL — ABNORMAL HIGH (ref 8–23)
CO2: 25 mmol/L (ref 22–32)
Calcium: 7.8 mg/dL — ABNORMAL LOW (ref 8.9–10.3)
Chloride: 94 mmol/L — ABNORMAL LOW (ref 98–111)
Creatinine, Ser: 1 mg/dL (ref 0.44–1.00)
GFR calc Af Amer: 59 mL/min — ABNORMAL LOW (ref >60)
GFR calc non Af Amer: 51 mL/min — ABNORMAL LOW (ref >60)
Glucose, Bld: 128 mg/dL — ABNORMAL HIGH (ref 70–99)
Potassium: 3.8 mmol/L (ref 3.5–5.1)
Sodium: 142 mmol/L (ref 135–145)

## 2018-06-03 LAB — RENAL FUNCTION PANEL
Albumin: 1.4 g/dL — ABNORMAL LOW (ref 3.5–5.0)
Anion gap: 17 — ABNORMAL HIGH (ref 5–15)
BUN: 130 mg/dL — ABNORMAL HIGH (ref 8–23)
CO2: 20 mmol/L — ABNORMAL LOW (ref 22–32)
Calcium: 9 mg/dL (ref 8.9–10.3)
Chloride: 95 mmol/L — ABNORMAL LOW (ref 98–111)
Creatinine, Ser: 2.03 mg/dL — ABNORMAL HIGH (ref 0.44–1.00)
GFR calc Af Amer: 25 mL/min — ABNORMAL LOW (ref >60)
GFR calc non Af Amer: 22 mL/min — ABNORMAL LOW (ref >60)
Glucose, Bld: 108 mg/dL — ABNORMAL HIGH (ref 70–99)
Phosphorus: 4 mg/dL (ref 2.5–4.6)
Potassium: 3.4 mmol/L — ABNORMAL LOW (ref 3.5–5.1)
Sodium: 132 mmol/L — ABNORMAL LOW (ref 135–145)

## 2018-06-03 LAB — TROPONIN I: Troponin I: 0.04 ng/mL (ref <0.03)

## 2018-06-03 LAB — MAGNESIUM: Magnesium: 2.1 mg/dL (ref 1.7–2.4)

## 2018-06-03 LAB — CK TOTAL AND CKMB (NOT AT ARMC)
CK, MB: 6.6 ng/mL — ABNORMAL HIGH (ref 0.5–5.0)
Relative Index: INVALID (ref 0.0–2.5)
Total CK: 40 U/L (ref 38–234)

## 2018-06-03 NOTE — Progress Notes (Signed)
[facility]  ROUNDING NOTE   Subjective:  Patient seen at bedside. Currently on BiPAP. Due for dialysis again today.   Objective:  Vital signs in last 24 hours:  Temperature 97.8 pulse 113 respirations 36 blood pressure 137/67  Physical Exam: General: Critically ill-appearing female  Head: Normocephalic, atraumatic. Moist oral mucosal membranes  Eyes: Anicteric  Neck: Supple, trachea midline  Lungs:  Scattered rhonchi  Heart: S1S2 no rubs  Abdomen:  Soft, nontender, bowel sounds present  Extremities: 1+ peripheral edema.  Neurologic: Lethargic, not following commands  Skin: No lesions       Basic Metabolic Panel: Recent Labs  Lab 05/28/18 1233 05/30/18 0725 05/31/18 1500 06/01/18 0325 06/02/18 0626  NA 127* 130* 131* 134*  --   K 3.4* 3.5 3.5 3.2* 3.5  CL 91* 94* 95* 99  --   CO2 23 23 24 24   --   GLUCOSE 156* 139* 101* 39*  --   BUN 183* 127* 157* 82*  --   CREATININE 2.38* 1.86* 1.93* 1.37*  --   CALCIUM 9.5 9.5 9.4 8.7*  --   PHOS 1.7* 1.5* 1.5* <1.0* 3.3    Liver Function Tests: Recent Labs  Lab 05/28/18 1233 05/30/18 0725 05/31/18 1500 06/01/18 0325  ALBUMIN 1.3* 1.4* 1.4* 1.5*   No results for input(s): LIPASE, AMYLASE in the last 168 hours. No results for input(s): AMMONIA in the last 168 hours.  CBC: Recent Labs  Lab 05/28/18 1233 05/30/18 0725 05/31/18 0320  WBC 15.9* 10.8* 11.8*  NEUTROABS  --   --  9.4*  HGB 6.9* 6.8* 8.0*  HCT 20.8* 20.4* 23.4*  MCV 81.6 81.3 82.4  PLT 210 227 177    Cardiac Enzymes: No results for input(s): CKTOTAL, CKMB, CKMBINDEX, TROPONINI in the last 168 hours.  BNP: Invalid input(s): POCBNP  CBG: No results for input(s): GLUCAP in the last 168 hours.  Microbiology: Results for orders placed or performed during the hospital encounter of 05/11/2018  Culture, blood (routine x 2)     Status: Abnormal   Collection Time: 05/12/18  6:20 PM  Result Value Ref Range Status   Specimen Description  BLOOD LEFT HAND  Final   Special Requests AEROBIC BOTTLE ONLY Blood Culture adequate volume  Final   Culture  Setup Time   Final    GRAM POSITIVE RODS AEROBIC BOTTLE ONLY CRITICAL RESULT CALLED TO, READ BACK BY AND VERIFIED WITH: Megan Salon RN 12:05 05/14/18 ([email or web address])    Culture (A)  Final    LACTOBACILLUS SPECIES Standardized susceptibility testing for this organism is not available.    Report Status 05/15/2018 FINAL  Final  Blood Culture ID Panel (Reflexed)     Status: None   Collection Time: 05/12/18  6:20 PM  Result Value Ref Range Status   Enterococcus species NOT DETECTED NOT DETECTED Final   Listeria monocytogenes NOT DETECTED NOT DETECTED Final   Staphylococcus species NOT DETECTED NOT DETECTED Final   Staphylococcus aureus (BCID) NOT DETECTED NOT DETECTED Final   Streptococcus species NOT DETECTED NOT DETECTED Final   Streptococcus agalactiae NOT DETECTED NOT DETECTED Final   Streptococcus pneumoniae NOT DETECTED NOT DETECTED Final   Streptococcus pyogenes NOT DETECTED NOT DETECTED Final   Acinetobacter baumannii NOT DETECTED NOT DETECTED Final   Enterobacteriaceae species NOT DETECTED NOT DETECTED Final   Enterobacter cloacae complex NOT DETECTED NOT DETECTED Final   Escherichia coli NOT DETECTED NOT DETECTED Final   Klebsiella oxytoca NOT DETECTED NOT DETECTED Final   Klebsiella  pneumoniae NOT DETECTED NOT DETECTED Final   Proteus species NOT DETECTED NOT DETECTED Final   Serratia marcescens NOT DETECTED NOT DETECTED Final   Haemophilus influenzae NOT DETECTED NOT DETECTED Final   Neisseria meningitidis NOT DETECTED NOT DETECTED Final   Pseudomonas aeruginosa NOT DETECTED NOT DETECTED Final   Candida albicans NOT DETECTED NOT DETECTED Final   Candida glabrata NOT DETECTED NOT DETECTED Final   Candida krusei NOT DETECTED NOT DETECTED Final   Candida parapsilosis NOT DETECTED NOT DETECTED Final   Candida tropicalis NOT DETECTED NOT DETECTED Final    Comment: Performed at  [facility], [place] [address] [place], [place] [address]  Culture, blood (routine x 2)     Status: None   Collection Time: 05/12/18  6:30 PM  Result Value Ref Range Status   Specimen Description BLOOD RIGHT HAND  Final   Special Requests AEROBIC BOTTLE ONLY Blood Culture adequate volume  Final   Culture   Final    NO GROWTH 5 DAYS Performed at [facility], [place] [address] [place], [place] [address]    Report Status 05/17/2018 FINAL  Final    Coagulation Studies: No results for input(s): LABPROT, INR in the last 72 hours.  Urinalysis: No results for input(s): COLORURINE, LABSPEC, PHURINE, GLUCOSEU, HGBUR, BILIRUBINUR, KETONESUR, PROTEINUR, UROBILINOGEN, NITRITE, LEUKOCYTESUR in the last 72 hours.  Invalid input(s): APPERANCEUR    Imaging: Dg Chest Port 1 View  Result Date: 06/01/2018 CLINICAL DATA:  Respiratory distress EXAM: PORTABLE CHEST 1 VIEW COMPARISON:  05/22/2018 and prior chest radiographs FINDINGS: Cardiomediastinal silhouette is unchanged. LEFT LOWER lung consolidation/atelectasis again noted. Interstitial opacities bilaterally are unchanged. There may be trace bilateral pleural effusions present. A LEFT IJ central venous catheter with tip overlying the SUPERIOR cavoatrial junction and small bore feeding tube entering the stomach with tip off the field of view again noted. There is no evidence of pneumothorax. IMPRESSION: Little significant change from the prior study with continued LEFT LOWER lung consolidation/atelectasis and bilateral interstitial opacities. Electronically Signed   By: Margarette Canada M.D.   On: 06/01/2018 14:07     Medications:       Assessment/ Plan:  85 y.o. female with a PMHx of stage IV sacral decubitus ulcer, failure to thrive, GERD, immobility, severe protein calorie malnutrition, recent acute renal failure, MGUS, diabetes mellitus type 2, hypertension, who was admitted to [facility] on 04/28/2018 for ongoing treatment of Candida glabrata  sepsis, pancolitis with gastritis, altered mental status, failure to thrive, protein calorie malnutrition, recurrent aspiration, and acute renal failure.  1.  Acute renal failure. 2.  Chronic kidney disease stage IV. 3.  Altered mental status. 4.  Failure to thrive. 5.  Severe protein calorie malnutrition. 6.  Acidosis. 7.  Anemia of chronic kidney disease.  Plan:   Critical illness persist.  Currently on BiPAP.  Due for dialysis later today.  We will prepare dialysis orders for today as well as on Wednesday.  Overall prognosis remains guarded without significant improvement in her underlying medical condition.  Otherwise plan as per hospitalist.   LOS: 0 Aster Screws 5/11/20208:51 AM

## 2018-06-04 LAB — BLOOD GAS, ARTERIAL
Acid-Base Excess: 1.2 mmol/L (ref 0.0–2.0)
Acid-base deficit: 0.6 mmol/L (ref 0.0–2.0)
Bicarbonate: 23.8 mmol/L (ref 20.0–28.0)
Bicarbonate: 21.7 mmol/L (ref 20.0–28.0)
FIO2: 50
FIO2: 50
MECHVT: 420 mL
MECHVT: 420 mL
O2 Saturation: 94.4 %
O2 Saturation: 94.6 %
PEEP: 5 cmH2O
PEEP: 5 cmH2O
Patient temperature: 98.6
Patient temperature: 98.6
RATE: 12 resp/min
RATE: 12 resp/min
pCO2 arterial: 28.6 mmHg — ABNORMAL LOW (ref 32.0–48.0)
pCO2 arterial: 24.8 mmHg — ABNORMAL LOW (ref 32.0–48.0)
pH, Arterial: 7.53 — ABNORMAL HIGH (ref 7.350–7.450)
pH, Arterial: 7.551 — ABNORMAL HIGH (ref 7.350–7.450)
pO2, Arterial: 66.2 mmHg — ABNORMAL LOW (ref 83.0–108.0)
pO2, Arterial: 64 mmHg — ABNORMAL LOW (ref 83.0–108.0)

## 2018-06-04 LAB — CBC
HCT: 25.2 % — ABNORMAL LOW (ref 36.0–46.0)
Hemoglobin: 8.9 g/dL — ABNORMAL LOW (ref 12.0–15.0)
MCH: 28.7 pg (ref 26.0–34.0)
MCHC: 35.3 g/dL (ref 30.0–36.0)
MCV: 81.3 fL (ref 80.0–100.0)
Platelets: 90 10*3/uL — ABNORMAL LOW (ref 150–400)
RBC: 3.1 MIL/uL — ABNORMAL LOW (ref 3.87–5.11)
RDW: 16 % — ABNORMAL HIGH (ref 11.5–15.5)
WBC: 27.3 10*3/uL — ABNORMAL HIGH (ref 4.0–10.5)
nRBC: 0.1 % (ref 0.0–0.2)

## 2018-06-04 LAB — PREPARE RBC (CROSSMATCH)

## 2018-06-05 LAB — TYPE AND SCREEN
ABO/RH(D): A POS
Antibody Screen: NEGATIVE
Unit division: 0

## 2018-06-05 LAB — BLOOD CULTURE ID PANEL (REFLEXED)

## 2018-06-05 LAB — RENAL FUNCTION PANEL
Albumin: 1.2 g/dL — ABNORMAL LOW (ref 3.5–5.0)
Anion gap: 18 — ABNORMAL HIGH (ref 5–15)
BUN: 90 mg/dL — ABNORMAL HIGH (ref 8–23)
CO2: 23 mmol/L (ref 22–32)
Calcium: 7.8 mg/dL — ABNORMAL LOW (ref 8.9–10.3)
Chloride: 94 mmol/L — ABNORMAL LOW (ref 98–111)
Creatinine, Ser: 1.84 mg/dL — ABNORMAL HIGH (ref 0.44–1.00)
GFR calc Af Amer: 28 mL/min — ABNORMAL LOW (ref >60)
GFR calc non Af Amer: 24 mL/min — ABNORMAL LOW (ref >60)
Glucose, Bld: 181 mg/dL — ABNORMAL HIGH (ref 70–99)
Phosphorus: 2.5 mg/dL (ref 2.5–4.6)
Potassium: 3.2 mmol/L — ABNORMAL LOW (ref 3.5–5.1)
Sodium: 135 mmol/L (ref 135–145)

## 2018-06-05 LAB — CBC
HCT: 24.3 % — ABNORMAL LOW (ref 36.0–46.0)
Hemoglobin: 8.5 g/dL — ABNORMAL LOW (ref 12.0–15.0)
MCH: 28.7 pg (ref 26.0–34.0)
MCHC: 35 g/dL (ref 30.0–36.0)
MCV: 82.1 fL (ref 80.0–100.0)
Platelets: 72 10*3/uL — ABNORMAL LOW (ref 150–400)
RBC: 2.96 MIL/uL — ABNORMAL LOW (ref 3.87–5.11)
RDW: 16.6 % — ABNORMAL HIGH (ref 11.5–15.5)
WBC: 23 10*3/uL — ABNORMAL HIGH (ref 4.0–10.5)
nRBC: 0 % (ref 0.0–0.2)

## 2018-06-05 LAB — BPAM RBC
Blood Product Expiration Date: 202005192359
ISSUE DATE / TIME: 202005120337
Unit Type and Rh: 6200

## 2018-06-05 MED FILL — Medication: Qty: 1 | Status: AC

## 2018-06-05 NOTE — Progress Notes (Signed)
[facility]  ROUNDING NOTE   Subjective:  Patient status worsened. Had cardiopulmonary arrest during last dialysis treatment. Currently on pressors and on the ventilator.     Objective:  Vital signs in last 24 hours:  Temperature 98 pulse 25 respirations 23 blood pressure 126/58  Physical Exam: General: Critically ill-appearing female  Head: Normocephalic, atraumatic. Moist oral mucosal membranes  Eyes: Anicteric  Neck: Supple, trachea midline  Lungs:  Scattered rhonchi  Heart: S1S2 no rubs  Abdomen:  Soft, nontender, bowel sounds present  Extremities: 1+ peripheral edema.  Neurologic: Lethargic, not following commands  Skin: No lesions       Basic Metabolic Panel: Recent Labs  Lab 05/31/18 1500 06/01/18 0325 06/02/18 0626 06/03/18 1100 06/03/18 1921 06/03/18 1959 06/05/18 0620  NA 131* 134*  --  132*  --  142 135  K 3.5 3.2* 3.5 3.4*  --  3.8 3.2*  CL 95* 99  --  95*  --  94* 94*  CO2 24 24  --  20*  --  25 23  GLUCOSE 101* 39*  --  108*  --  128* 181*  BUN 157* 82*  --  130*  --  40* 90*  CREATININE 1.93* 1.37*  --  2.03*  --  1.00 1.84*  CALCIUM 9.4 8.7*  --  9.0  --  7.8* 7.8*  MG  --   --   --   --  2.1  --   --   PHOS 1.5* <1.0* 3.3 4.0  --   --  2.5    Liver Function Tests: Recent Labs  Lab 05/30/18 0725 05/31/18 1500 06/01/18 0325 06/03/18 1100 06/05/18 0620  ALBUMIN 1.4* 1.4* 1.5* 1.4* 1.2*   No results for input(s): LIPASE, AMYLASE in the last 168 hours. No results for input(s): AMMONIA in the last 168 hours.  CBC: Recent Labs  Lab 05/31/18 0320 06/03/18 1100 06/03/18 1959 06/04/18 1505 06/05/18 0620  WBC 11.8* 11.2* 21.1* 27.3* 23.0*  NEUTROABS 9.4*  --   --   --   --   HGB 8.0* 8.2* 6.5* 8.9* 8.5*  HCT 23.4* 24.1* 19.9* 25.2* 24.3*  MCV 82.4 83.1 86.5 81.3 82.1  PLT 177 139* 124* 90* 72*    Cardiac Enzymes: Recent Labs  Lab 06/03/18 1959  CKTOTAL 40  CKMB 6.6*  TROPONINI 0.04*    BNP: Invalid input(s):  POCBNP  CBG: No results for input(s): GLUCAP in the last 168 hours.  Microbiology: Results for orders placed or performed during the hospital encounter of 05/20/2018  Culture, blood (routine x 2)     Status: Abnormal   Collection Time: 05/12/18  6:20 PM  Result Value Ref Range Status   Specimen Description BLOOD LEFT HAND  Final   Special Requests AEROBIC BOTTLE ONLY Blood Culture adequate volume  Final   Culture  Setup Time   Final    GRAM POSITIVE RODS AEROBIC BOTTLE ONLY CRITICAL RESULT CALLED TO, READ BACK BY AND VERIFIED WITH: Megan Salon RN 12:05 05/14/18 ([email or web address])    Culture (A)  Final    LACTOBACILLUS SPECIES Standardized susceptibility testing for this organism is not available.    Report Status 05/15/2018 FINAL  Final  Blood Culture ID Panel (Reflexed)     Status: None   Collection Time: 05/12/18  6:20 PM  Result Value Ref Range Status   Enterococcus species NOT DETECTED NOT DETECTED Final   Listeria monocytogenes NOT DETECTED NOT DETECTED Final   Staphylococcus species NOT DETECTED NOT  DETECTED Final   Staphylococcus aureus (BCID) NOT DETECTED NOT DETECTED Final   Streptococcus species NOT DETECTED NOT DETECTED Final   Streptococcus agalactiae NOT DETECTED NOT DETECTED Final   Streptococcus pneumoniae NOT DETECTED NOT DETECTED Final   Streptococcus pyogenes NOT DETECTED NOT DETECTED Final   Acinetobacter baumannii NOT DETECTED NOT DETECTED Final   Enterobacteriaceae species NOT DETECTED NOT DETECTED Final   Enterobacter cloacae complex NOT DETECTED NOT DETECTED Final   Escherichia coli NOT DETECTED NOT DETECTED Final   Klebsiella oxytoca NOT DETECTED NOT DETECTED Final   Klebsiella pneumoniae NOT DETECTED NOT DETECTED Final   Proteus species NOT DETECTED NOT DETECTED Final   Serratia marcescens NOT DETECTED NOT DETECTED Final   Haemophilus influenzae NOT DETECTED NOT DETECTED Final   Neisseria meningitidis NOT DETECTED NOT DETECTED Final   Pseudomonas aeruginosa NOT  DETECTED NOT DETECTED Final   Candida albicans NOT DETECTED NOT DETECTED Final   Candida glabrata NOT DETECTED NOT DETECTED Final   Candida krusei NOT DETECTED NOT DETECTED Final   Candida parapsilosis NOT DETECTED NOT DETECTED Final   Candida tropicalis NOT DETECTED NOT DETECTED Final    Comment: Performed at [facility], [place] [address], [place], [place] [address]  Culture, blood (routine x 2)     Status: None   Collection Time: 05/12/18  6:30 PM  Result Value Ref Range Status   Specimen Description BLOOD RIGHT HAND  Final   Special Requests AEROBIC BOTTLE ONLY Blood Culture adequate volume  Final   Culture   Final    NO GROWTH 5 DAYS Performed at [facility], [place] [address], [place], [place] [address]    Report Status 05/17/2018 FINAL  Final  Culture, blood (routine x 2)     Status: None (Preliminary result)   Collection Time: 06/03/18  7:40 PM  Result Value Ref Range Status   Specimen Description SITE NOT SPECIFIED  Final   Special Requests   Final    BOTTLES DRAWN AEROBIC ONLY Blood Culture adequate volume   Culture   Final    NO GROWTH < 24 HOURS Performed at [facility], [place] [address] [place], [place] [address]    Report Status PENDING  Incomplete  Culture, blood (routine x 2)     Status: None (Preliminary result)   Collection Time: 06/03/18  7:45 PM  Result Value Ref Range Status   Specimen Description SITE NOT SPECIFIED  Final   Special Requests   Final    BOTTLES DRAWN AEROBIC ONLY Blood Culture adequate volume   Culture  Setup Time   Final    AEROBIC BOTTLE ONLY GRAM POSITIVE COCCI CRITICAL RESULT CALLED TO, READ BACK BY AND VERIFIED WITH: H TATA RN 06/05/18 0042 JDW Performed at [facility], [place] [address] [place], [place] [address]    Culture GRAM POSITIVE COCCI  Final   Report Status PENDING  Incomplete  Blood Culture ID Panel (Reflexed)     Status: Abnormal   Collection Time: 06/03/18  8:33 PM  Result Value Ref Range Status    Enterococcus species NOT DETECTED NOT DETECTED Final   Listeria monocytogenes NOT DETECTED NOT DETECTED Final   Staphylococcus species DETECTED (A) NOT DETECTED Final    Comment: Methicillin (oxacillin) resistant coagulase negative staphylococcus. Possible blood culture contaminant (unless isolated from more than one blood culture draw or clinical case suggests pathogenicity). No antibiotic treatment is indicated for blood  culture contaminants. CRITICAL RESULT CALLED TO, READ BACK  BY AND VERIFIED WITH: H TATA RN 06/05/18 0042 JDW    Staphylococcus aureus (BCID) NOT DETECTED NOT DETECTED Final   Methicillin resistance DETECTED (A) NOT DETECTED Final    Comment: CRITICAL RESULT CALLED TO, READ BACK BY AND VERIFIED WITH: H TATA RN 06/05/18 0042 JDW    Streptococcus species NOT DETECTED NOT DETECTED Final   Streptococcus agalactiae NOT DETECTED NOT DETECTED Final   Streptococcus pneumoniae NOT DETECTED NOT DETECTED Final   Streptococcus pyogenes NOT DETECTED NOT DETECTED Final   Acinetobacter baumannii NOT DETECTED NOT DETECTED Final   Enterobacteriaceae species NOT DETECTED NOT DETECTED Final   Enterobacter cloacae complex NOT DETECTED NOT DETECTED Final   Escherichia coli NOT DETECTED NOT DETECTED Final   Klebsiella oxytoca NOT DETECTED NOT DETECTED Final   Klebsiella pneumoniae NOT DETECTED NOT DETECTED Final   Proteus species NOT DETECTED NOT DETECTED Final   Serratia marcescens NOT DETECTED NOT DETECTED Final   Haemophilus influenzae NOT DETECTED NOT DETECTED Final   Neisseria meningitidis NOT DETECTED NOT DETECTED Final   Pseudomonas aeruginosa NOT DETECTED NOT DETECTED Final   Candida albicans NOT DETECTED NOT DETECTED Final   Candida glabrata NOT DETECTED NOT DETECTED Final   Candida krusei NOT DETECTED NOT DETECTED Final   Candida parapsilosis NOT DETECTED NOT DETECTED Final   Candida tropicalis NOT DETECTED NOT DETECTED Final    Comment: Performed at [facility],  [place] [place],  [address]    Coagulation Studies: No results for input(s): LABPROT, INR in the last 72 hours.  Urinalysis: No results for input(s): COLORURINE, LABSPEC, PHURINE, GLUCOSEU, HGBUR, BILIRUBINUR, KETONESUR, PROTEINUR, UROBILINOGEN, NITRITE, LEUKOCYTESUR in the last 72 hours.  Invalid input(s): APPERANCEUR    Imaging: Dg Chest Port 1 View  Result Date: 06/03/2018 CLINICAL DATA:  Intubation. Pneumonia. Chronic respiratory failure. EXAM: PORTABLE CHEST 1 VIEW COMPARISON:  Chest x-ray dated 06/01/2018 FINDINGS: The left-sided dialysis catheter tip is well positioned. The endotracheal tube terminates above the carina by approximately 2.2 cm. Enteric tube is noted which terminates below the left hemidiaphragm. There is nonspecific gaseous distention of loops of colon in the left upper quadrant. Diffuse interstitial airspace opacities are again noted bilaterally. A retrocardiac opacity is again seen but is partially obscured by a defibrillator pad. There is blunting of the costophrenic angles bilaterally. There is no pneumothorax. No acute osseous abnormality. IMPRESSION: 1. Lines and tubes as above. Endotracheal tube terminates 2.2 cm above the carina. 2. Persistent left lower lobe airspace opacity, partially obscured by a defibrillator pad 3. No pneumothorax.  No acute osseous abnormality. Electronically Signed   By: Constance Holster M.D.   On: 06/03/2018 19:49     Medications:       Assessment/ Plan:  85 y.o. female with a PMHx of stage IV sacral decubitus ulcer, failure to thrive, GERD, immobility, severe protein calorie malnutrition, recent acute renal failure, MGUS, diabetes mellitus type 2, hypertension, who was admitted to [facility] on 05/20/2018 for ongoing treatment of Candida glabrata sepsis, pancolitis with gastritis, altered mental status, failure to thrive, protein calorie malnutrition, recurrent aspiration, and acute renal failure.  1.  Acute renal  failure. 2.  Chronic kidney disease stage IV. 3.  Altered mental status. 4.  Failure to thrive. 5.  Severe protein calorie malnutrition. 6.  Acidosis. 7.  Anemia of chronic kidney disease. 8.  Acute respiratory failure.   Plan:   Patient worse evaluation.  She coded during the treatment.  He remains very unstable at the moment.  She is currently tachycardia and on pressors.  Family apparently coming tomorrow.  We plan to defer any further dialysis treatments at the moment given instability.  We plan to discuss with further with the patient's family.  Overall prognosis quite poor.  LOS: 0 Taejah Ohalloran 5/13/202012:07 PM

## 2018-06-06 LAB — CULTURE, BLOOD (ROUTINE X 2): Special Requests: ADEQUATE

## 2018-06-08 LAB — CULTURE, BLOOD (ROUTINE X 2)
Culture: NO GROWTH
Special Requests: ADEQUATE

## 2018-06-24 DEATH — deceased

## 2020-04-11 IMAGING — DX ABDOMEN - 1 VIEW
1 series · 1 of 1 positions shown · non-contrast
Comparison: CT, 04/29/2018

CLINICAL DATA: Confirm nasogastric tube placement.

EXAM:
ABDOMEN - 1 VIEW

[abdomen kub]
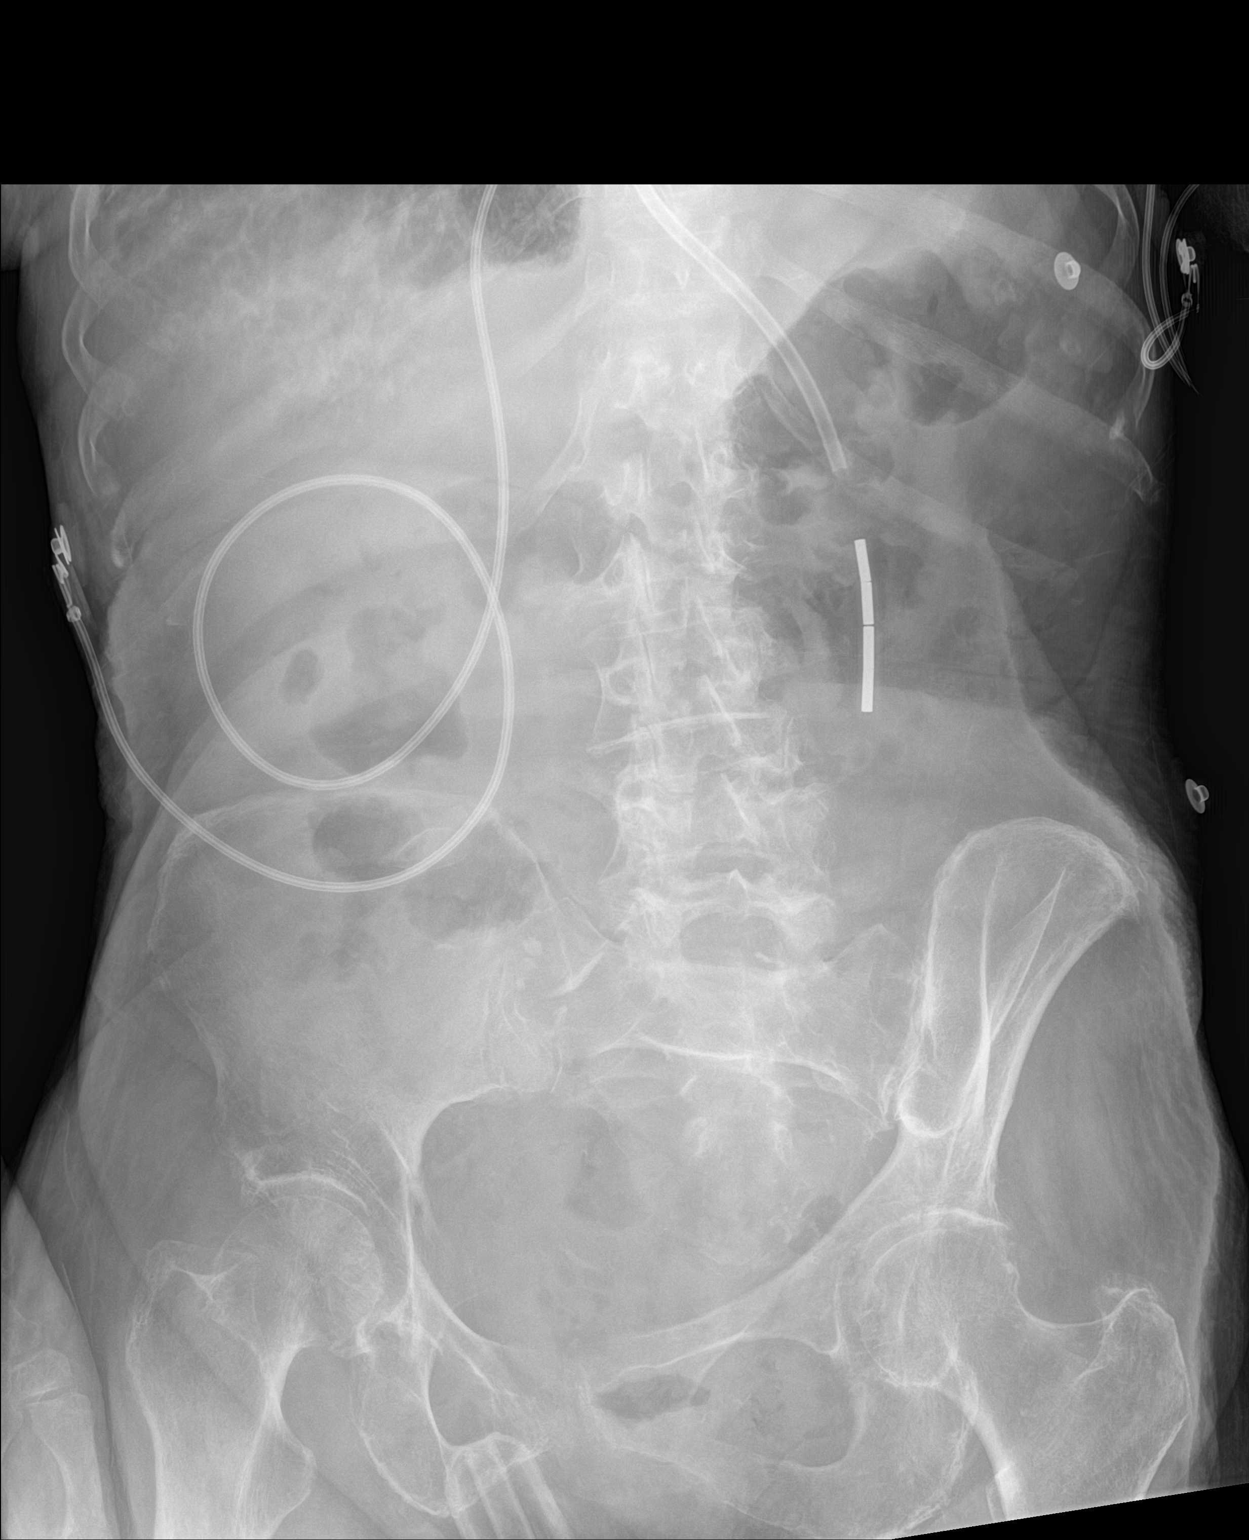

[1 of 1 positions shown; findings below may reference images not displayed]

FINDINGS: Nasogastric tube passes below the diaphragm. Metallic tip projects
in the mid stomach.

Normal bowel gas pattern.
IMPRESSION: Nasogastric tube metallic tip projects in the mid stomach.

## 2020-04-17 IMAGING — US IR LEFT FLUORO GUIDE CV LINE
1 series · 3 of 3 positions shown · non-contrast
Comparison: none

INDICATION: 88-year-old with acute renal failure and request for dialysis
catheter.

[Series 1: ir left fluoro guide cv line · 3 of 3 slices shown]
[im 1/3]
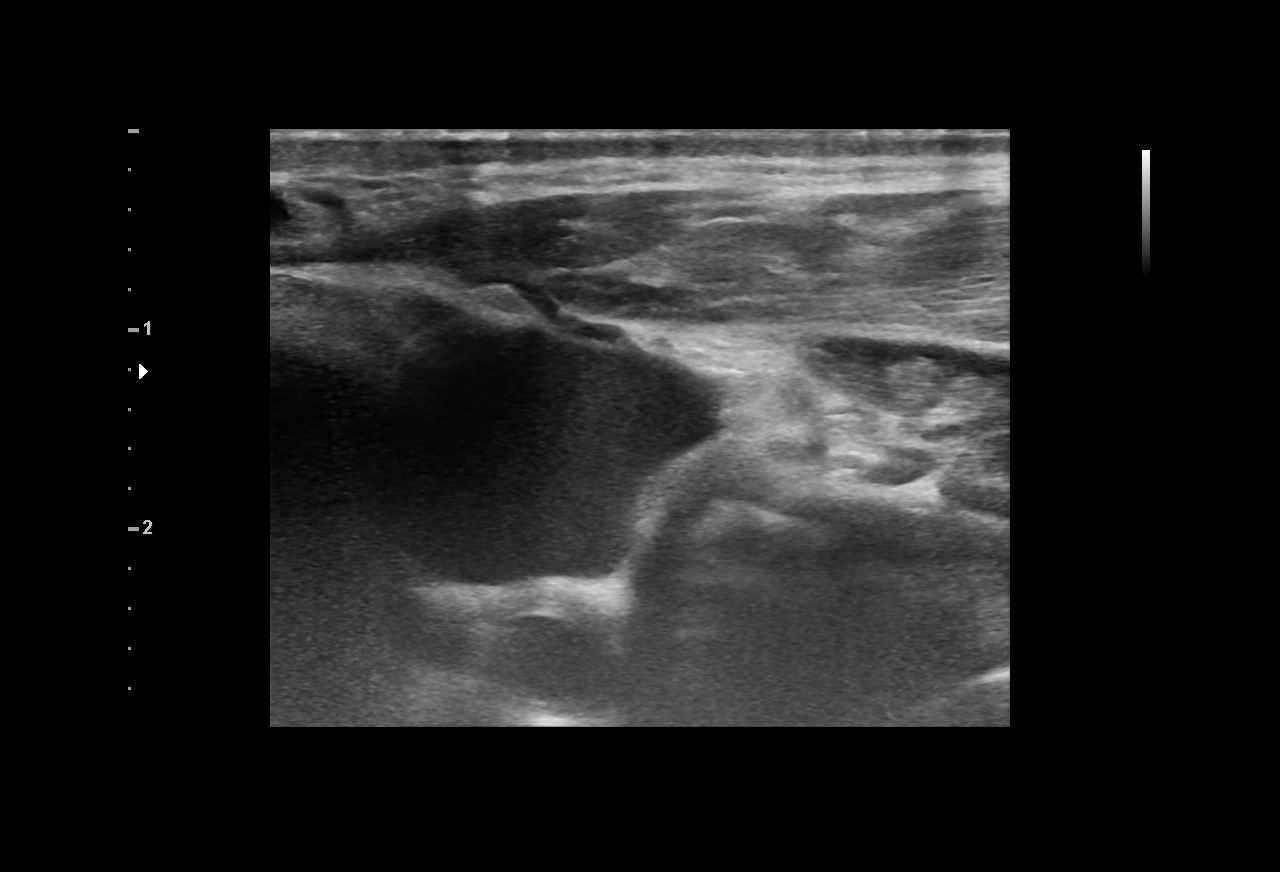
[im 2/3]
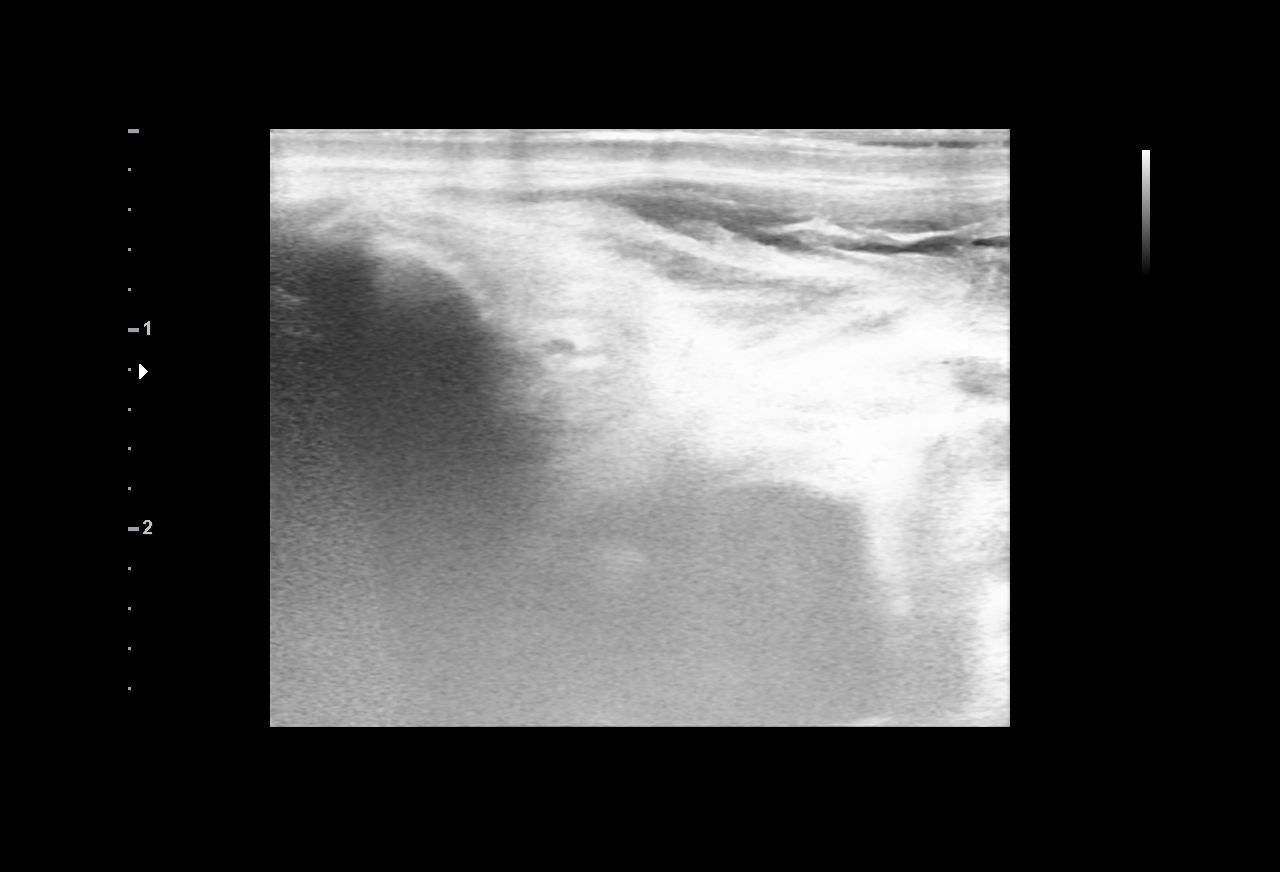
[im 3/3]
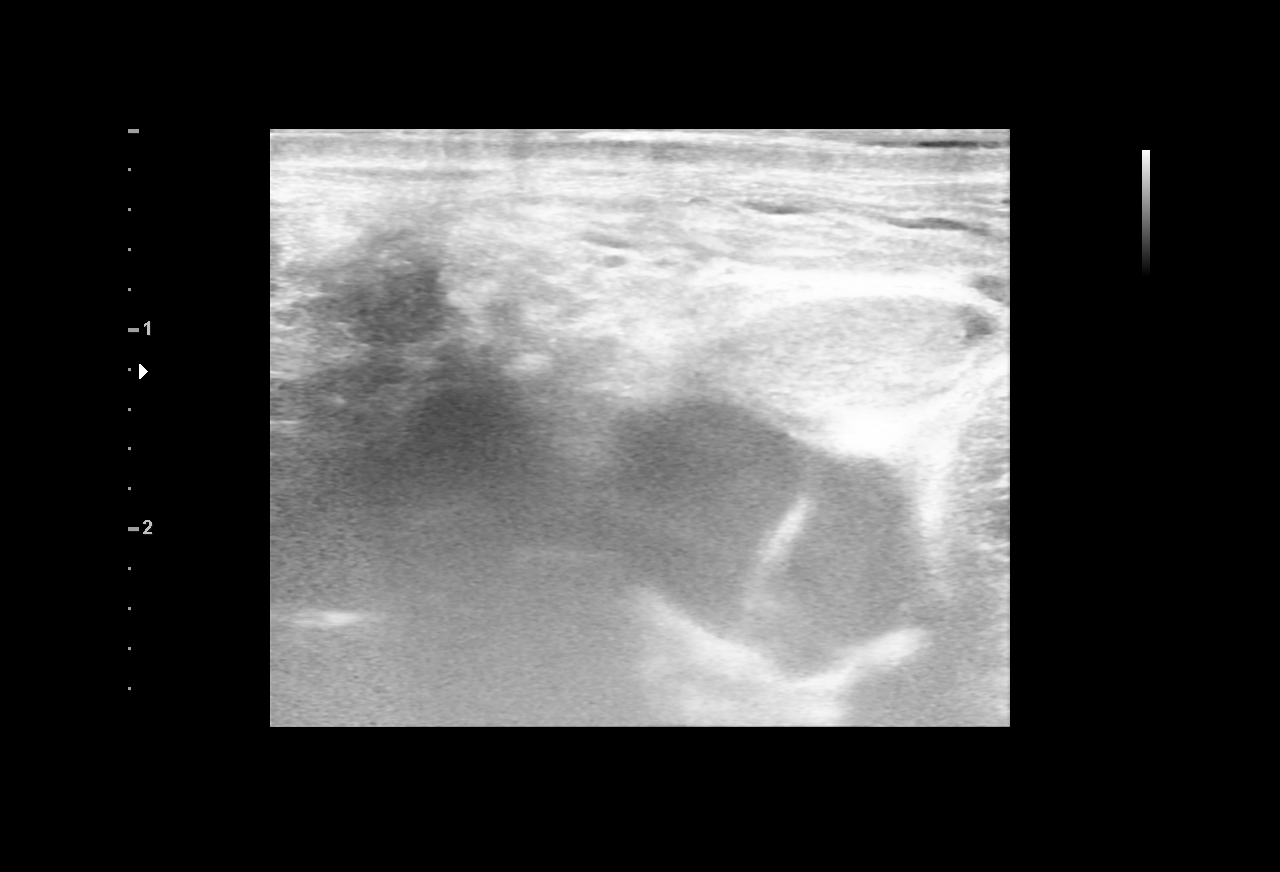

[3 of 3 positions shown; findings below may reference images not displayed]

EXAM:
FLUOROSCOPIC AND ULTRASOUND GUIDED PLACEMENT OF A NON-TUNNELED
DIALYSIS CATHETER

MEDICATIONS:
None

ANESTHESIA/SEDATION:
None

FLUOROSCOPY TIME:  Fluoroscopy Time: 54 seconds, 1 mGy

COMPLICATIONS:
None immediate.

PROCEDURE:
Informed consent was obtained for catheter placement. The patient
was placed supine on the interventional table. Ultrasound confirmed
a patent left internal jugular vein. Ultrasound images were obtained
for documentation. The left side of the neck was prepped and draped
in a sterile fashion. The left side of the neck was anesthetized
with 1% lidocaine. Maximal barrier sterile technique was utilized
including caps, mask, sterile gowns, sterile gloves, sterile drape,
hand hygiene and skin antiseptic. A small incision was made with #11
blade scalpel. A 21 gauge needle directed into the left internal
jugular vein with ultrasound guidance. A micropuncture dilator set
was placed. A 20 cm Mahurkar catheter was selected. The catheter was
advanced over a wire and positioned at the superior cavoatrial
junction. Fluoroscopic images were obtained for documentation. Both
dialysis lumens were found to aspirate and flush well. The proper
amount of heparin was flushed in both lumens. The central venous
lumen was flushed with normal saline. Catheter was sutured to skin.
FINDINGS: Catheter tip at the superior cavoatrial junction.
IMPRESSION: Successful placement of a left jugular non-tunneled dialysis
catheter using ultrasound and fluoroscopic guidance.

## 2020-05-11 IMAGING — DX PORTABLE CHEST - 1 VIEW
1 series · 1 of 1 positions shown · non-contrast
Comparison: 05/22/2018 and prior chest radiographs

CLINICAL DATA: Respiratory distress

EXAM:
PORTABLE CHEST 1 VIEW

[chest ap]
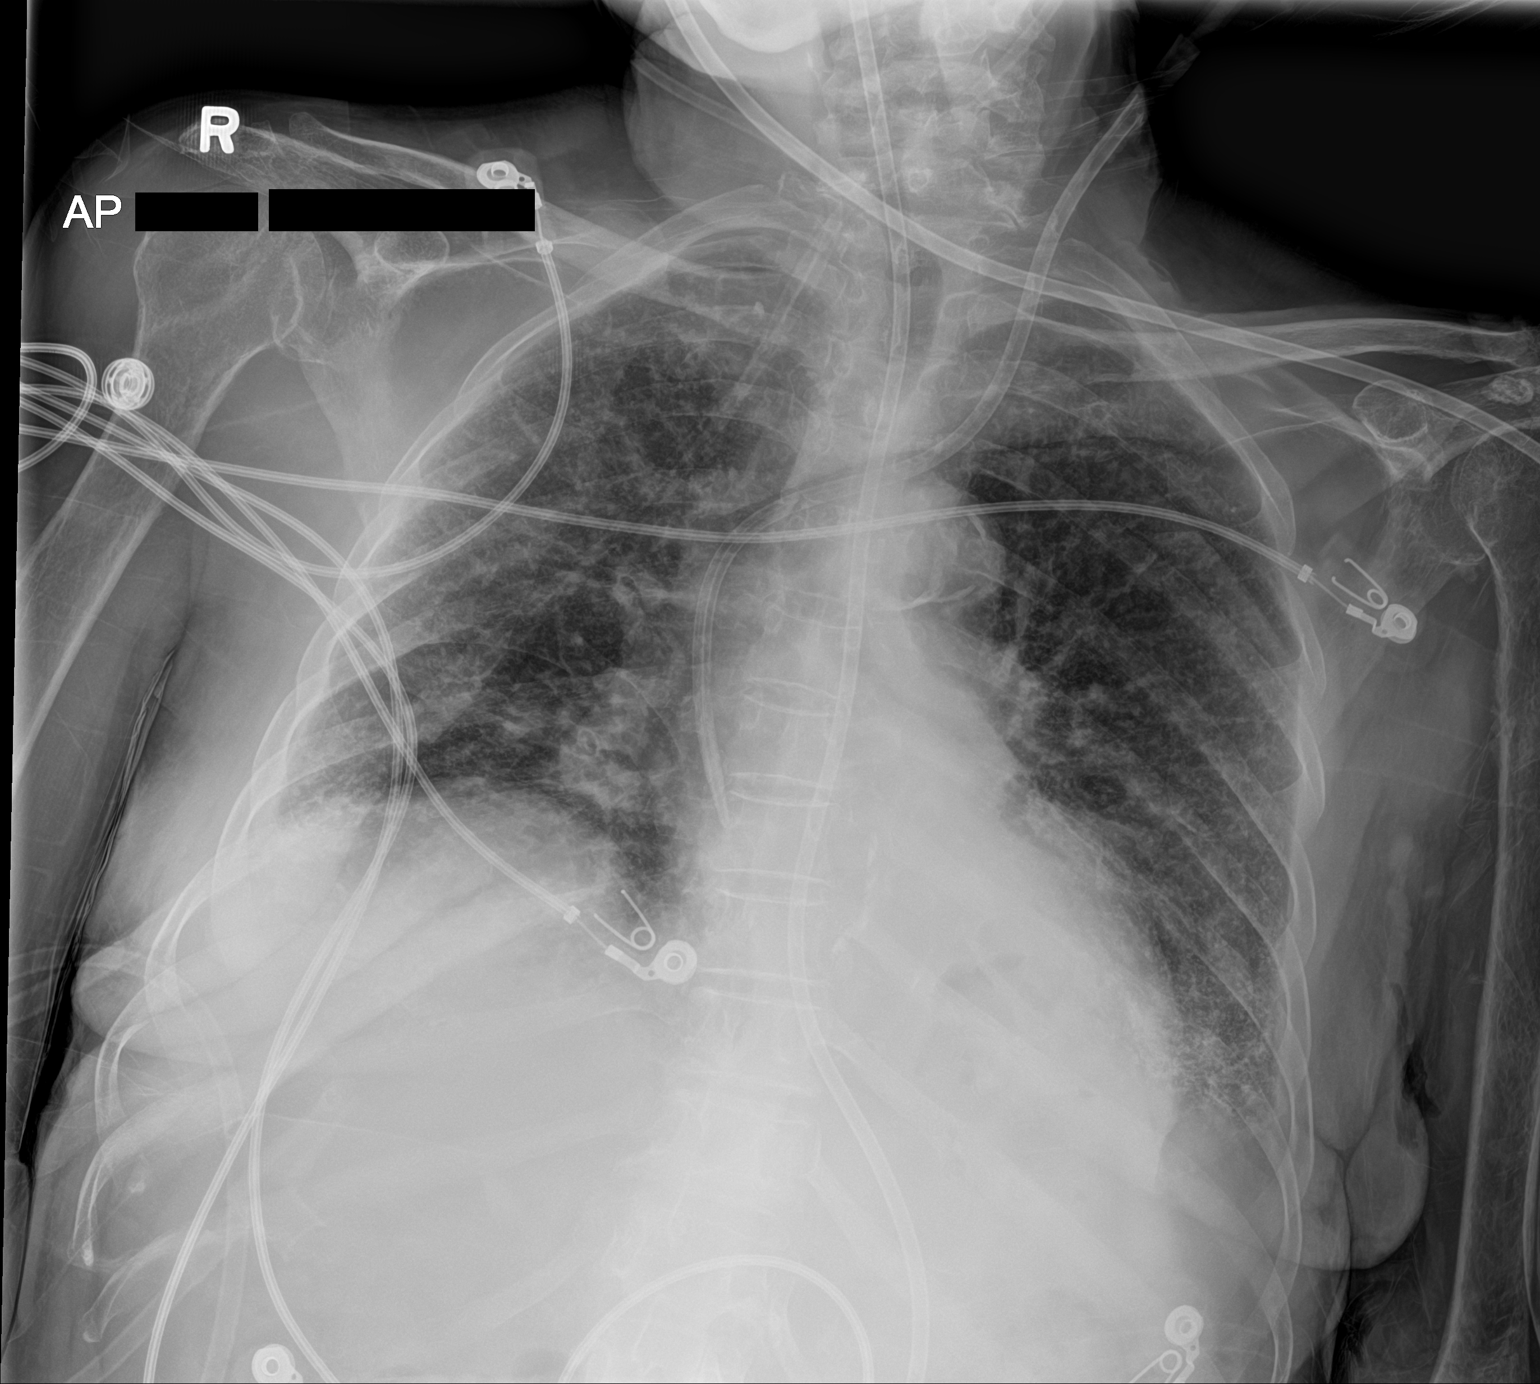

[1 of 1 positions shown; findings below may reference images not displayed]

FINDINGS: Cardiomediastinal silhouette is unchanged.

LEFT LOWER lung consolidation/atelectasis again noted.

Interstitial opacities bilaterally are unchanged.

There may be trace bilateral pleural effusions present.

A LEFT IJ central venous catheter with tip overlying the SUPERIOR
cavoatrial junction and small bore feeding tube entering the stomach
with tip off the field of view again noted.

There is no evidence of pneumothorax.
IMPRESSION: Little significant change from the prior study with continued LEFT
LOWER lung consolidation/atelectasis and bilateral interstitial
opacities.
